# Patient Record
Sex: Male | Born: 1990
Health system: Southern US, Community
[De-identification: ages and names within clinical notes are randomized; demographics above are authoritative.]

## PROBLEM LIST (undated history)

## (undated) DIAGNOSIS — I214 Non-ST elevation (NSTEMI) myocardial infarction: Secondary | ICD-10-CM

## (undated) DIAGNOSIS — T7840XA Allergy, unspecified, initial encounter: Secondary | ICD-10-CM

## (undated) HISTORY — DX: Allergy, unspecified, initial encounter: T78.40XA

## (undated) HISTORY — PX: HIP ARTHROSCOPY: SHX668

## (undated) HISTORY — DX: Non-ST elevation (NSTEMI) myocardial infarction: I21.4

---

## 2014-04-10 HISTORY — PX: HIP ARTHROSCOPY: SHX668

## 2019-08-28 DIAGNOSIS — M545 Low back pain: Secondary | ICD-10-CM | POA: Diagnosis not present

## 2019-08-28 DIAGNOSIS — M791 Myalgia, unspecified site: Secondary | ICD-10-CM | POA: Diagnosis not present

## 2019-08-28 DIAGNOSIS — M9902 Segmental and somatic dysfunction of thoracic region: Secondary | ICD-10-CM | POA: Diagnosis not present

## 2019-08-28 DIAGNOSIS — M62830 Muscle spasm of back: Secondary | ICD-10-CM | POA: Diagnosis not present

## 2019-09-01 DIAGNOSIS — M791 Myalgia, unspecified site: Secondary | ICD-10-CM | POA: Diagnosis not present

## 2019-09-01 DIAGNOSIS — M9902 Segmental and somatic dysfunction of thoracic region: Secondary | ICD-10-CM | POA: Diagnosis not present

## 2019-09-01 DIAGNOSIS — M62830 Muscle spasm of back: Secondary | ICD-10-CM | POA: Diagnosis not present

## 2019-09-01 DIAGNOSIS — M545 Low back pain: Secondary | ICD-10-CM | POA: Diagnosis not present

## 2019-09-10 DIAGNOSIS — S86812A Strain of other muscle(s) and tendon(s) at lower leg level, left leg, initial encounter: Secondary | ICD-10-CM | POA: Diagnosis not present

## 2019-09-11 DIAGNOSIS — H5213 Myopia, bilateral: Secondary | ICD-10-CM | POA: Diagnosis not present

## 2019-09-11 DIAGNOSIS — H52223 Regular astigmatism, bilateral: Secondary | ICD-10-CM | POA: Diagnosis not present

## 2019-09-17 DIAGNOSIS — S86812A Strain of other muscle(s) and tendon(s) at lower leg level, left leg, initial encounter: Secondary | ICD-10-CM | POA: Diagnosis not present

## 2019-09-24 DIAGNOSIS — M545 Low back pain: Secondary | ICD-10-CM | POA: Diagnosis not present

## 2019-10-01 DIAGNOSIS — S86812A Strain of other muscle(s) and tendon(s) at lower leg level, left leg, initial encounter: Secondary | ICD-10-CM | POA: Diagnosis not present

## 2019-10-01 DIAGNOSIS — M545 Low back pain: Secondary | ICD-10-CM | POA: Diagnosis not present

## 2019-12-03 DIAGNOSIS — Z20822 Contact with and (suspected) exposure to covid-19: Secondary | ICD-10-CM | POA: Diagnosis not present

## 2020-01-01 DIAGNOSIS — Z23 Encounter for immunization: Secondary | ICD-10-CM | POA: Diagnosis not present

## 2020-03-08 DIAGNOSIS — Z23 Encounter for immunization: Secondary | ICD-10-CM | POA: Diagnosis not present

## 2020-04-06 DIAGNOSIS — Z20822 Contact with and (suspected) exposure to covid-19: Secondary | ICD-10-CM | POA: Diagnosis not present

## 2020-11-30 ENCOUNTER — Ambulatory Visit
Admission: EM | Admit: 2020-11-30 | Discharge: 2020-11-30 | Disposition: A | Discharge status: Other Acute Inpatient Hospital | Payer: BC Managed Care – PPO

## 2020-11-30 ENCOUNTER — Ambulatory Visit: Payer: Self-pay | Admitting: Medical

## 2020-11-30 ENCOUNTER — Ambulatory Visit (INDEPENDENT_AMBULATORY_CARE_PROVIDER_SITE_OTHER): Payer: BC Managed Care – PPO

## 2020-11-30 ENCOUNTER — Encounter: Payer: Self-pay | Admitting: Medical

## 2020-11-30 ENCOUNTER — Other Ambulatory Visit: Payer: Self-pay

## 2020-11-30 VITALS — BP 108/76 | HR 80 | Temp 98.6°F | Resp 18 | Wt 203.4 lb

## 2020-11-30 DIAGNOSIS — I319 Disease of pericardium, unspecified: Secondary | ICD-10-CM | POA: Diagnosis not present

## 2020-11-30 DIAGNOSIS — R9431 Abnormal electrocardiogram [ECG] [EKG]: Secondary | ICD-10-CM | POA: Diagnosis not present

## 2020-11-30 DIAGNOSIS — R079 Chest pain, unspecified: Secondary | ICD-10-CM | POA: Diagnosis not present

## 2020-11-30 DIAGNOSIS — R918 Other nonspecific abnormal finding of lung field: Secondary | ICD-10-CM | POA: Diagnosis not present

## 2020-11-30 DIAGNOSIS — R0781 Pleurodynia: Secondary | ICD-10-CM | POA: Diagnosis not present

## 2020-11-30 DIAGNOSIS — Z20822 Contact with and (suspected) exposure to covid-19: Secondary | ICD-10-CM | POA: Diagnosis not present

## 2020-11-30 DIAGNOSIS — I499 Cardiac arrhythmia, unspecified: Secondary | ICD-10-CM | POA: Diagnosis not present

## 2020-11-30 DIAGNOSIS — J90 Pleural effusion, not elsewhere classified: Secondary | ICD-10-CM | POA: Diagnosis not present

## 2020-11-30 DIAGNOSIS — M79622 Pain in left upper arm: Secondary | ICD-10-CM

## 2020-11-30 DIAGNOSIS — R0602 Shortness of breath: Secondary | ICD-10-CM | POA: Diagnosis not present

## 2020-11-30 DIAGNOSIS — R071 Chest pain on breathing: Secondary | ICD-10-CM

## 2020-11-30 DIAGNOSIS — J189 Pneumonia, unspecified organism: Secondary | ICD-10-CM | POA: Diagnosis not present

## 2020-11-30 DIAGNOSIS — M79602 Pain in left arm: Secondary | ICD-10-CM | POA: Diagnosis not present

## 2020-11-30 DIAGNOSIS — I300 Acute nonspecific idiopathic pericarditis: Secondary | ICD-10-CM | POA: Diagnosis not present

## 2020-11-30 DIAGNOSIS — I214 Non-ST elevation (NSTEMI) myocardial infarction: Secondary | ICD-10-CM | POA: Diagnosis not present

## 2020-11-30 DIAGNOSIS — R778 Other specified abnormalities of plasma proteins: Secondary | ICD-10-CM | POA: Diagnosis not present

## 2020-11-30 DIAGNOSIS — M549 Dorsalgia, unspecified: Secondary | ICD-10-CM

## 2020-11-30 DIAGNOSIS — R0789 Other chest pain: Secondary | ICD-10-CM | POA: Diagnosis not present

## 2020-11-30 NOTE — ED Triage Notes (Signed)
Pt c/o left-sided chest wall pain since Sunday night. Pt states he has pain with inhalation/exhalation and movements, also when laying on his back. Pt also reports pain to his left shoulder and arm that seems to come and go. Pt states the pain seems to improve during the day when he is more active and returns once he is at rest. Pt denies any known injuries or heavy lifting. Pt denies n/v or other symptoms.

## 2020-11-30 NOTE — Patient Instructions (Signed)
Nonspecific Chest Pain Chest pain can be caused by many different conditions. Some causes of chest pain can be life-threatening. These will require treatment right away. Serious causes of chest pain include: Heart attack. A tear in the body's main blood vessel. Redness and swelling (inflammation) around your heart. Blood clot in your lungs. Other causes of chest pain may not be so serious. These include: Heartburn. Anxiety or stress.  Damage to bones or muscles in your chest. Lung infections. Chest pain can feel like: Pain or discomfort in your chest. Crushing, pressure, aching, or squeezing pain. Burning or tingling. Dull or sharp pain that is worse when you move, cough, or take a deep breath. Pain or discomfort that is also felt in your back, neck, jaw, shoulder, or arm, or pain that spreads to any of these areas. It is hard to know whether your pain is caused by something that is serious or something that is not so serious. So it is important to see your doctor rightaway if you have chest pain. Follow these instructions at home: Medicines Take over-the-counter and prescription medicines only as told by your doctor. If you were prescribed an antibiotic medicine, take it as told by your doctor. Do not stop taking the antibiotic even if you start to feel better. Lifestyle  Rest as told by your doctor. Do not use any products that contain nicotine or tobacco, such as cigarettes, e-cigarettes, and chewing tobacco. If you need help quitting, ask your doctor. Do not drink alcohol. Make lifestyle changes as told by your doctor. These may include: Getting regular exercise. Ask your doctor what activities are safe for you. Eating a heart-healthy diet. A diet and nutrition specialist (dietitian) can help you to learn healthy eating options. Staying at a healthy weight. Treating diabetes or high blood pressure, if needed. Lowering your stress. Activities such as yoga and relaxation techniques  can help.  General instructions Pay attention to any changes in your symptoms. Tell your doctor about them or any new symptoms. Avoid any activities that cause chest pain. Keep all follow-up visits as told by your doctor. This is important. You may need more testing if your chest pain does not go away. Contact a doctor if: Your chest pain does not go away. You feel depressed. You have a fever. Get help right away if: Your chest pain is worse. You have a cough that gets worse, or you cough up blood. You have very bad (severe) pain in your belly (abdomen). You pass out (faint). You have either of these for no clear reason: Sudden chest discomfort. Sudden discomfort in your arms, back, neck, or jaw. You have shortness of breath at any time. You suddenly start to sweat, or your skin gets clammy. You feel sick to your stomach (nauseous). You throw up (vomit). You suddenly feel lightheaded or dizzy. You feel very weak or tired. Your heart starts to beat fast, or it feels like it is skipping beats. These symptoms may be an emergency. Do not wait to see if the symptoms will go away. Get medical help right away. Call your local emergency services (911 in the [place] Do not drive yourself to the hospital. Summary Chest pain can be caused by many different conditions. The cause may be serious and need treatment right away. If you have chest pain, see your doctor right away. Follow your doctor's instructions for taking medicines and making lifestyle changes. Keep all follow-up visits as told by your doctor. This includes visits for any   further testing if your chest pain does not go away. Be sure to know the signs that show that your condition has become worse. Get help right away if you have these symptoms. This information is not intended to replace advice given to you by your health care provider. Make sure you discuss any questions you have with your healthcare provider. Document Revised:  09/27/2017 Document Reviewed: 09/27/2017 Elsevier Patient Education  2022 Elsevier Inc.  

## 2020-11-30 NOTE — Progress Notes (Signed)
Subjective:    Patient ID: Cory Hamilton, male    DOB: 06/29/1990, 30 y.o.   MRN: [id number]  HPI 30 yo male in non acute distress presents today with chest and back pain worse at night while laying flat. started Sunday nigh whern laying down to go to bed, thought it was indigestion  (he drank a beer before bed).  Felt better today however hurts with breathing in and with laying down even without breathing in.Feels like the pain felt better laying on the left side. Shoulder and upper arm left side hurt  woke up yesterday with pain and worsen at  8pm. With shoulder and back pain increased with laying down shoulder and . Some shortness of breath and feels he cannot take a deep breath.  Denies pain in jaw. Took Ibuprofen 600 mg  yesterday 8:30 pm did seem to help with the pain. And today at  7am. No prior history of pain like this. Has no Anxiety history.  No history of Covid-19 took an at home test last night and it was negative. Vaccinated Pfizer and one booster.  Poor sleep since Sunday due to his pain when lying flat,  he has a an 70 week old boy and he has a 31 yo too.  Better last ngiht but took  Unisom  1/2 tablet at bedtime. 10pm.  No histroy of Marfans syndrome Parients are healthy except father just go diagnosis of diabetes this year. No cardiac history in family Grandfater died of a stroke but he does not know much more then that.   Blood pressure 108/76, pulse 80, temperature 98.6 F (37 C), temperature source Oral, resp. rate 18, weight 203 lb 6.4 oz (92.3 kg), SpO2 96 %.   No Known Allergies  Review of Systems  Constitutional:  Positive for fatigue. Negative for chills and fever (has felt hot at night).  HENT:  Negative for congestion, ear pain, sinus pressure, sinus pain, sneezing, sore throat and tinnitus.   Eyes:  Negative for visual disturbance.  Respiratory:  Positive for shortness of breath. Negative for cough.   Cardiovascular:  Positive for chest pain.   Gastrointestinal:  Negative for abdominal pain, blood in stool, constipation and diarrhea.  Genitourinary:  Negative for difficulty urinating.  Musculoskeletal:  Positive for back pain.  Allergic/Immunologic: Positive for environmental allergies. Negative for food allergies and immunocompromised state.  Neurological:  Negative for dizziness, syncope, light-headedness and headaches.  Hematological:  Negative for adenopathy.  Psychiatric/Behavioral:  Positive for sleep disturbance (due to pain and haviang an  28 week old at home and significant pain while laying down.). Negative for confusion and decreased concentration. The patient is not nervous/anxious.      6'2" 203.bs  Pneumonia at age 54 yo/ Took toddler to the pool on Sunday about  noon, no pain then.  Non smoker. Objective:   Physical Exam Vitals and nursing note reviewed.  Constitutional:      Appearance: Normal appearance. He is normal weight.  HENT:     Head: Normocephalic and atraumatic.     Right Ear: Tympanic membrane, ear canal and external ear normal.     Left Ear: Tympanic membrane, ear canal and external ear normal.     Mouth/Throat:     Mouth: Mucous membranes are moist.     Pharynx: Oropharynx is clear.  Eyes:     Extraocular Movements: Extraocular movements intact.     Conjunctiva/sclera: Conjunctivae normal.     Pupils: Pupils are equal, round, and  reactive to light.  Cardiovascular:     Rate and Rhythm: Normal rate and regular rhythm.     Heart sounds: Normal heart sounds.  Pulmonary:     Effort: Pulmonary effort is normal.     Breath sounds: Rhonchi present. Wheezes: left side. Musculoskeletal:        General: Normal range of motion.     Cervical back: Normal range of motion and neck supple.  Skin:    General: Skin is warm and dry.     Capillary Refill: Capillary refill takes less than 2 seconds.  Neurological:     General: No focal deficit present.     Mental Status: He is alert and oriented to  person, place, and time.  Psychiatric:        Mood and Affect: Mood normal.        Behavior: Behavior normal.        Thought Content: Thought content normal.   He does not appear in any distress, no SOB, no grimacing with pain.  Rumbling sound on the left middle back     Assessment & Plan:  Chest and back pain. Shortness of breath. Increased pain while lying flat, improved with lying on left side. I recommend he go to an urgent care for further evaluation like a  CXR, labs, EKG. He currently does not have [employer] so  I am unable to order labs or tests. He lives over by [facility] and will stop by after work. Patient verbalizes understanding and has no questions at discharge. Given discharge paperwork and my card to call if he has any concerns.

## 2020-11-30 NOTE — Discharge Instructions (Addendum)
I do not have a good reason for you to have a fluid collection in the pleural space of your lung on the left.  I believe this is what is causing her chest pain but you need blood work and advanced imaging of your chest to determine the source.  As we discussed, I feel you would be best served to be evaluated in the emergency department.  As you have elected, please go to [facility] for further evaluation.

## 2020-11-30 NOTE — ED Provider Notes (Signed)
[facility]    CSN: [id number] Arrival date & time: 11/30/20  1646      History   Chief Complaint Chief Complaint  Patient presents with   Chest Pain    ribs, left    HPI Cory Hamilton is a 30 y.o. male.   HPI  30 year old male here for evaluation of chest pain.  Patient reports that 2 nights ago he went to lay down for bed and he noticed that he was having pain on his left side of his chest.  He states that he has pain with deep breathing and it hurts when he lays on his back, his chest, on his left side.  He also reports that the pain seems to get little better when he lays on his left side.  This is associated with some shortness of breath because he states he cannot take a deep breath.  The pain is worse at night and better throughout the day.  He denies cough, nausea, sweating, heavy lifting, recent travel.  He is not a smoker and he is not had any injury that he is aware of.  Patient does report that the pain feels better when he leans forward and he has found himself doing that multiple times throughout the day.  He is currently rating his pain as a 7/10.  The pain is constant in nature and he describes it as an ache in the shoulder but a stabbing pain in the chest.  History reviewed. No pertinent past medical history.  There are no problems to display for this patient.   Past Surgical History:  Procedure Laterality Date   HIP ARTHROSCOPY N/A    2016       Home Medications    Prior to Admission medications   Medication Sig Start Date End Date Taking? Authorizing Provider  fexofenadine (ALLEGRA) 180 MG tablet Take 180 mg by mouth daily.   Yes [provider]  ibuprofen (ADVIL) 600 MG tablet Take 600 mg by mouth every 6 (six) hours as needed.    [provider]    Family History Family History  Problem Relation Age of Onset   Cancer Maternal Grandmother    Stroke Maternal Grandfather     Social History Social History    Tobacco Use   Smoking status: Never   Smokeless tobacco: Never  Substance Use Topics   Alcohol use: Yes    Alcohol/week: 9.0 standard drinks    Types: 9 Standard drinks or equivalent per week   Drug use: Never     Allergies   Patient has no known allergies.   Review of Systems Review of Systems  Constitutional:  Negative for activity change, appetite change, diaphoresis and fever.  Respiratory:  Positive for shortness of breath. Negative for cough and wheezing.   Cardiovascular:  Positive for chest pain.  Gastrointestinal:  Negative for nausea.  Musculoskeletal:  Negative for myalgias.  Hematological: Negative.   Psychiatric/Behavioral: Negative.      Physical Exam Triage Vital Signs ED Triage Vitals  Enc Vitals Group     BP 11/30/20 1703 130/86     Pulse Rate 11/30/20 1703 68     Resp 11/30/20 1703 18     Temp 11/30/20 1703 99.2 F (37.3 C)     Temp Source 11/30/20 1703 Oral     SpO2 11/30/20 1703 100 %     Weight 11/30/20 1701 203 lb (92.1 kg)     Height 11/30/20 1701 6\' 2"  (1.88  m)     Head Circumference --      Peak Flow --      Pain Score 11/30/20 1701 7     Pain Loc --      Pain Edu? --      Excl. in GC? --    No data found.  Updated Vital Signs BP 130/86 (BP Location: Left Arm)   Pulse 68   Temp 99.2 F (37.3 C) (Oral)   Resp 18   Ht 6\' 2"  (1.88 m)   Wt 203 lb (92.1 kg)   SpO2 100%   BMI 26.06 kg/m   Visual Acuity Right Eye Distance:   Left Eye Distance:   Bilateral Distance:    Right Eye Near:   Left Eye Near:    Bilateral Near:     Physical Exam Vitals and nursing note reviewed.  Constitutional:      General: He is not in acute distress.    Appearance: Normal appearance. He is well-developed and normal weight. He is not ill-appearing.  HENT:     Head: Normocephalic and atraumatic.  Neck:     Vascular: No carotid bruit.  Cardiovascular:     Rate and Rhythm: Normal rate and regular rhythm.     Pulses: Normal pulses.     Heart  sounds: Normal heart sounds. No murmur heard.   No friction rub. No gallop.  Pulmonary:     Effort: Pulmonary effort is normal.     Breath sounds: Normal breath sounds. No wheezing, rhonchi or rales.  Chest:     Chest wall: No tenderness.  Musculoskeletal:     Cervical back: Normal range of motion and neck supple.  Skin:    General: Skin is warm and dry.     Capillary Refill: Capillary refill takes less than 2 seconds.  Neurological:     General: No focal deficit present.     Mental Status: He is alert and oriented to person, place, and time.  Psychiatric:        Mood and Affect: Mood normal.        Behavior: Behavior normal.        Thought Content: Thought content normal.        Judgment: Judgment normal.     UC Treatments / Results  Labs (all labs ordered are listed, but only abnormal results are displayed) Labs Reviewed - No data to display  EKG   Radiology DG Chest 2 View  Result Date: 11/30/2020 CLINICAL DATA:  Left-sided chest wall pain for 2 days, pain with respiration EXAM: CHEST - 2 VIEW COMPARISON:  None. FINDINGS: Frontal and lateral views of the chest demonstrate an unremarkable cardiac silhouette. No acute airspace disease or pneumothorax. Trace left pleural effusion blunting the left lateral costophrenic angle. No acute bony abnormalities. IMPRESSION: 1. Trace left pleural effusion. 2. No acute airspace disease.  The Electronically Signed   By: 12/02/2020 M.D.   On: 11/30/2020 17:42    Procedures Procedures (including critical care time)  Medications Ordered in UC Medications - No data to display  Initial Impression / Assessment and Plan / UC Course  I have reviewed the triage vital signs and the nursing notes.  Pertinent labs & imaging results that were available during my care of the patient were reviewed by me and considered in my medical decision making (see chart for details).  Patient is a very pleasant, nontoxic-appearing 30 year old male here  for evaluation of left-sided chest pain that has been  present for the last 2 days it is not associated with cough, nausea, sweating, injury, heavy lifting, travel, or neck pain.  Patient reports that he first noticed the pain when he went to lay down to go to sleep and he does endorse that the pain is worse at night and then improves throughout the day when he is up moving.  The pain is worse if he lays on his back, left arm, or chest.  He states that laying on his left arm does improve the pain some.  He also endorses that if he leans forward the pain improves.  He denies any palpitations, or syncope.  Patient's physical exam reveals a benign cardiopulmonary exam with S1-S2 heart sounds and are free of murmur, rub, or gallop.  Lung sounds are clear auscultation all fields.  The chest pain is not reproducible with palpation of the chest wall either anterior or posterior.  No carotid bruits appreciated on exam.  Patient's peripheral pulses are 2+ globally.  Etiology of patient's chest pain is unclear.  The pain does worsen with movement or deep breathing.  Will obtain chest x-ray and EKG.  Radiology interpretation of chest x-ray is there is a trace left pleural effusion with no additional acute airspace disease.  There is a blunting of the left costophrenic angle.  EKG shows sinus bradycardia with a ventricular rate of 59 bpm, PR interval of 154 ms, QRS duration 96 ms, QT 406 ms, QTC 401 ms.  There is no ST elevation or depression noted on tracing.  I advised the patient that I do not have any good reason for him to have a pleural effusion as he has no history of cancer, lung injury, or infection.  He needs additional blood work, which we are unable to do at this time at the urgent care, and may be more advanced imaging of his chest.  Advised patient that I would recommend he go to the emergency department for evaluation and he has agreed.  He is elected to go to [facility] in stable condition via  privately owned vehicle.  Final Clinical Impressions(s) / UC Diagnoses   Final diagnoses:  Chest pain, unspecified type     Discharge Instructions      I do not have a good reason for you to have a fluid collection in the pleural space of your lung on the left.  I believe this is what is causing her chest pain but you need blood work and advanced imaging of your chest to determine the source.  As we discussed, I feel you would be best served to be evaluated in the emergency department.  As you have elected, please go to [facility] for further evaluation.     ED Prescriptions   None    PDMP not reviewed this encounter.   Becky Augusta, NP 11/30/20 [phone]

## 2020-11-30 NOTE — ED Notes (Addendum)
Patient is being discharged from the Urgent Care and sent to the Emergency Department via POV . Per Becky Augusta, NP, patient is in need of higher level of care due to cxr results showing pleural effusion . Patient is aware and verbalizes understanding of plan of care.  Vitals:   11/30/20 1703  BP: 130/86  Pulse: 68  Resp: 18  Temp: 99.2 F (37.3 C)  SpO2: 100%

## 2020-12-01 DIAGNOSIS — I319 Disease of pericardium, unspecified: Secondary | ICD-10-CM

## 2020-12-01 DIAGNOSIS — J189 Pneumonia, unspecified organism: Secondary | ICD-10-CM | POA: Insufficient documentation

## 2020-12-01 DIAGNOSIS — R7989 Other specified abnormal findings of blood chemistry: Secondary | ICD-10-CM | POA: Insufficient documentation

## 2020-12-01 DIAGNOSIS — R778 Other specified abnormalities of plasma proteins: Secondary | ICD-10-CM | POA: Insufficient documentation

## 2020-12-01 DIAGNOSIS — R0781 Pleurodynia: Secondary | ICD-10-CM | POA: Insufficient documentation

## 2020-12-01 HISTORY — DX: Pneumonia, unspecified organism: J18.9

## 2020-12-01 HISTORY — DX: Other specified abnormal findings of blood chemistry: R79.89

## 2020-12-01 HISTORY — DX: Disease of pericardium, unspecified: I31.9

## 2020-12-02 ENCOUNTER — Telehealth: Payer: Self-pay | Admitting: Nurse Practitioner

## 2020-12-02 ENCOUNTER — Other Ambulatory Visit: Payer: Self-pay

## 2020-12-02 DIAGNOSIS — J90 Pleural effusion, not elsewhere classified: Secondary | ICD-10-CM

## 2020-12-02 NOTE — Progress Notes (Signed)
Patient calling into [facility] for follow up today after visit earlier this week. Patient was directed to Emergency Room for acute cardio/respiratory symptoms.   Shortness of breath had acute onset without any URI symptoms prior. Respiratory panel in ED was negative for RSV/FLU/COVID.   CT of chest and Xray performed to IMPRESSION: No large pulmonary embolism.  Consolidative and groundglass opacities in the left lower lobe concerning for infection or aspiration. Recommend follow-up imaging in 4-6 weeks to document resolution. This results in vasoconstriction and along with the cardiac pulsation artifact limits assessment of the small subsegmental vessels of the left lower lobe.  Small left pleural effusion.  Patient had acute onset of chest pain with SOB. Had elevated troponin at time of admission  EKG: NORMAL SINUS RHYTHM  POSSIBLE LEFT ATRIAL ENLARGEMENT  NONSPECIFIC T WAVE ABNORMALITY  ABNORMAL ECG  WHEN COMPARED WITH ECG OF 30-Nov-2020 18:49,  NONSPECIFIC T WAVE ABNORMALITY NOW EVIDENT IN ANTERIOR LEADS  QT HAS LENGTHENED    Patient currently has no PCP, was advised to have follow up in 2 weeks, repeat imaging in 6-8 weeks to assure resolution.   Is currently on antibiotics Amoxicillin and Doxycycline   Current Outpatient Medications  Medication Instructions   amoxicillin (AMOXIL) 500 MG capsule Oral   colchicine 0.6 mg, Oral, 2 times daily   doxycycline (VIBRAMYCIN) 100 MG capsule Oral   fexofenadine (ALLEGRA) 180 MG tablet Oral   fexofenadine (ALLEGRA) 180 mg, Oral, Daily   ibuprofen (ADVIL) 600 mg, Oral, Every 6 hours PRN   lidocaine (LIDODERM) 5 % Transdermal     After review of chart without pertinent history advised referral to pulmonology for evaluation of images and second opinion. Will initiate referral since patient does not currently have PCP  If unable to be seen in the next two weeks will have patient follow up with [facility] at [facility]  to discuss ordering follow up imaging as advised

## 2020-12-17 ENCOUNTER — Ambulatory Visit
Admission: RE | Admit: 2020-12-17 | Discharge: 2020-12-17 | Disposition: A | Discharge status: Home / Self Care | Payer: BC Managed Care – PPO | Source: Ambulatory Visit | Attending: Family Medicine | Admitting: Family Medicine

## 2020-12-17 ENCOUNTER — Ambulatory Visit: Payer: BC Managed Care – PPO | Admitting: Family Medicine

## 2020-12-17 ENCOUNTER — Other Ambulatory Visit: Payer: Self-pay

## 2020-12-17 ENCOUNTER — Ambulatory Visit
Admission: RE | Admit: 2020-12-17 | Discharge: 2020-12-17 | Disposition: A | Discharge status: Home / Self Care | Payer: BC Managed Care – PPO | Attending: Family Medicine | Admitting: Family Medicine

## 2020-12-17 ENCOUNTER — Encounter: Payer: Self-pay | Admitting: Family Medicine

## 2020-12-17 VITALS — BP 128/80 | HR 66 | Temp 98.2°F | Ht 74.0 in | Wt 203.0 lb

## 2020-12-17 DIAGNOSIS — J189 Pneumonia, unspecified organism: Secondary | ICD-10-CM

## 2020-12-17 DIAGNOSIS — I319 Disease of pericardium, unspecified: Secondary | ICD-10-CM

## 2020-12-17 DIAGNOSIS — S8991XA Unspecified injury of right lower leg, initial encounter: Secondary | ICD-10-CM | POA: Diagnosis not present

## 2020-12-17 DIAGNOSIS — S8990XA Unspecified injury of unspecified lower leg, initial encounter: Secondary | ICD-10-CM | POA: Diagnosis not present

## 2020-12-17 MED ORDER — COLCHICINE 0.6 MG PO TABS: 0.6000 mg | ORAL_TABLET | Freq: Two times a day (BID) | ORAL | 1 refills | Status: DC

## 2020-12-17 NOTE — Patient Instructions (Addendum)
-   Obtain blood work with order provided - Obtain knee x-ray with order provided - Referral coordinator will contact regards to cardiology follow-up - Continue colchicine 0.6 mg twice daily - Increase activity in a stepwise manner as discussed using chest and knee symptoms as a guide - Return for follow-up for right knee - We will coordinate a future follow-up after you have seen cardiology - Contact our office for any questions over the interim

## 2020-12-17 NOTE — Progress Notes (Signed)
Primary Care / Sports Medicine Office Visit  Patient Information:  Patient ID: Cory Hamilton, male DOB: 07-16-1990 Age: 30 y.o. MRN: [id number]   Cory Hamilton is a pleasant 30 y.o. male presenting with the following:  Chief Complaint  Patient presents with   New Patient (Initial Visit)   Establish Care    [facility] follow-up, admitted 11/30/20 for chest pain, pneumonia, NSTEMI; no longer symptomatic per patient    Review of Systems pertinent details above   Patient Active Problem List   Diagnosis Date Noted   Community acquired pneumonia of left lower lobe of lung 12/01/2020   Elevated troponin 12/01/2020   Myopericarditis 12/01/2020   Pleuritic chest pain 12/01/2020   Past Medical History:  Diagnosis Date   NSTEMI (non-ST elevated myocardial infarction) [facility])    Outpatient Encounter Medications as of 12/17/2020  Medication Sig   fexofenadine (ALLEGRA) 180 MG tablet Take 180 mg by mouth daily.   lidocaine (LIDODERM) 5 % Place 1 patch onto the skin daily.   [DISCONTINUED] colchicine 0.6 MG tablet Take 0.6 mg by mouth 2 (two) times daily.   colchicine 0.6 MG tablet Take 1 tablet (0.6 mg total) by mouth 2 (two) times daily.   ibuprofen (ADVIL) 600 MG tablet Take 600 mg by mouth every 6 (six) hours as needed. (Patient not taking: Reported on 12/17/2020)   [DISCONTINUED] doxycycline (VIBRAMYCIN) 100 MG capsule Take by mouth.   [DISCONTINUED] fexofenadine (ALLEGRA) 180 MG tablet Take by mouth.   No facility-administered encounter medications on file as of 12/17/2020.   Past Surgical History:  Procedure Laterality Date   HIP ARTHROSCOPY  2016    Vitals:   12/17/20 1438  BP: 128/80  Pulse: 66  Temp: 98.2 F (36.8 C)  SpO2: 97%   Vitals:   12/17/20 1438  Weight: 203 lb (92.1 kg)  Height: 6\' 2"  (1.88 m)   Body mass index is 26.06 kg/m.  DG Chest 2 View  Result Date: 11/30/2020 CLINICAL DATA:  Left-sided chest wall pain for 2 days, pain with  respiration EXAM: CHEST - 2 VIEW COMPARISON:  None. FINDINGS: Frontal and lateral views of the chest demonstrate an unremarkable cardiac silhouette. No acute airspace disease or pneumothorax. Trace left pleural effusion blunting the left lateral costophrenic angle. No acute bony abnormalities. IMPRESSION: 1. Trace left pleural effusion. 2. No acute airspace disease.  The Electronically Signed   By: 12/02/2020 M.D.   On: 11/30/2020 17:42     Independent interpretation of notes and tests performed by another provider:   None  Procedures performed:   None  Pertinent History, Exam, Impression, and Recommendations:   Community acquired pneumonia of left lower lobe of lung Noted on work-up for chest pain evaluation, was admitted to [facility] on 11/30/2020, was noted to have elevated troponin which trended down during admission, evidence of myopericarditis with elevated inflammatory markers.  Patient was given antibiotics, started on colchicine 0.6 mg twice daily, and was discharged on 12/01/2020.  He did complete 5 days of antibiotics after discharge from hospital, denies any current respiratory symptoms, noted these to resolve shortly after discharge, has been compliant with medication activity guidelines.  Pertinent physical exam findings today reveal oxygen saturation of 97%, patient is resting comfortably in no acute distress, clear lung fields throughout without wheeze, rales, rhonchi.  CT scan obtained while admitted did show left lower lung field consolidation, per radiologist this was recommended to be followed up in 4-6 weeks, chest x-ray  future order was placed and the patient was advised to proceed with this appropriately in 4 weeks.  Myopericarditis See additional assessment(s) for plan details.  Noted to have evidence of myopericarditis while admitted, positional chest pain noted, this is since resolved since discharge from hospital.  He has been compliant with twice daily colchicine  0.6 mg dosing.  He had previously been noting chest pain with positional change and deep inspiration, this has also resolved.  Physical examination today significant for prominent S2, otherwise regular rate and rhythm, while leaning forward a questionable friction rub appreciated.  No JVD noted.  I reviewed patient's current symptomatology and physical examination findings, clinically he is doing well with scheduled colchicine.  After a discussion on next steps, shared decision making pursued and he is amenable to follow-up with cardiology for additional evaluation and management options inclusive of duration for colchicine dosing among evaluation of risk factors.  I have ordered autoimmune serum work-up which we will follow once resulted.   Orders & Medications Meds ordered this encounter  Medications   colchicine 0.6 MG tablet    Sig: Take 1 tablet (0.6 mg total) by mouth 2 (two) times daily.    Dispense:  60 tablet    Refill:  1   Orders Placed This Encounter  Procedures   DG Chest 2 View   DG Knee Complete 4 Views Right   ANA 12 Plus Profile (RDL)   Ambulatory referral to Cardiology     Return for Right knee eval.     Jerrol Banana, MD   Primary Care Sports Medicine [facility]

## 2020-12-17 NOTE — Assessment & Plan Note (Signed)
Noted on work-up for chest pain evaluation, was admitted to [facility] on 11/30/2020, was noted to have elevated troponin which trended down during admission, evidence of myopericarditis with elevated inflammatory markers.  Patient was given antibiotics, started on colchicine 0.6 mg twice daily, and was discharged on 12/01/2020.  He did complete 5 days of antibiotics after discharge from hospital, denies any current respiratory symptoms, noted these to resolve shortly after discharge, has been compliant with medication activity guidelines.  Pertinent physical exam findings today reveal oxygen saturation of 97%, patient is resting comfortably in no acute distress, clear lung fields throughout without wheeze, rales, rhonchi.  CT scan obtained while admitted did show left lower lung field consolidation, per radiologist this was recommended to be followed up in 4-6 weeks, chest x-ray future order was placed and the patient was advised to proceed with this appropriately in 4 weeks.

## 2020-12-17 NOTE — Assessment & Plan Note (Addendum)
See additional assessment(s) for plan details.  Noted to have evidence of myopericarditis while admitted, positional chest pain noted, this is since resolved since discharge from hospital.  He has been compliant with twice daily colchicine 0.6 mg dosing.  He had previously been noting chest pain with positional change and deep inspiration, this has also resolved.  Physical examination today significant for prominent S2, otherwise regular rate and rhythm, while leaning forward a questionable friction rub appreciated.  No JVD noted.  I reviewed patient's current symptomatology and physical examination findings, clinically he is doing well with scheduled colchicine.  After a discussion on next steps, shared decision making pursued and he is amenable to follow-up with cardiology for additional evaluation and management options inclusive of duration for colchicine dosing among evaluation of risk factors.  I have ordered autoimmune serum work-up which we will follow once resulted.

## 2020-12-23 NOTE — Progress Notes (Signed)
[name], there are minor findings at the patellofemoral compartment of the knee which we can review alongside your symptoms at your visit on 12/24/20

## 2020-12-24 ENCOUNTER — Ambulatory Visit (INDEPENDENT_AMBULATORY_CARE_PROVIDER_SITE_OTHER): Payer: BC Managed Care – PPO | Admitting: Family Medicine

## 2020-12-24 ENCOUNTER — Encounter: Payer: Self-pay | Admitting: Family Medicine

## 2020-12-24 ENCOUNTER — Other Ambulatory Visit: Payer: Self-pay

## 2020-12-24 VITALS — BP 112/72 | HR 66 | Temp 98.1°F | Ht 74.0 in | Wt 202.0 lb

## 2020-12-24 DIAGNOSIS — Z23 Encounter for immunization: Secondary | ICD-10-CM | POA: Insufficient documentation

## 2020-12-24 DIAGNOSIS — M2391 Unspecified internal derangement of right knee: Secondary | ICD-10-CM | POA: Diagnosis not present

## 2020-12-24 DIAGNOSIS — M222X1 Patellofemoral disorders, right knee: Secondary | ICD-10-CM | POA: Diagnosis not present

## 2020-12-24 NOTE — Assessment & Plan Note (Signed)
Annual influenza vaccine administered today. 

## 2020-12-24 NOTE — Patient Instructions (Signed)
-   Wear knee brace throughout your wakeful hours x2 weeks then reserve use for periods of athletic/strenuous activity - Start physical therapy with referral provided, they will instruct on home exercises to perform daily - Continue colchicine as it provides anti-inflammatory benefits - Return for follow-up in 6 weeks, contact our office for questions between now and then

## 2020-12-24 NOTE — Assessment & Plan Note (Addendum)
>>  ASSESSMENT AND PLAN FOR RIGHT PATELLOFEMORAL SYNDROME WRITTEN ON 12/24/2020 11:32 AM BY Starlena Beil J, MD  Patient with anteromedial knee pain since traumatic onset 10/2020 where he was kicked at the medial right knee while playing soccer game.  He was able to continue playing, did not seek medical attention as pain has been relatively minimal since that time.  He has noted intermittent sharp pain to the medial knee, additionally significant stiffness with motion after period of immobility.  He does note minor sensations of instability associated with these, however denies buckling, near buckling, mechanical catching.  Examination reveals full painless range of motion from 0-135 degrees, tenderness at the medial joint line, positive medial McMurray, equivocal valgus stressing without laxity though with pain, dynamic patellar maltracking noted; remainder of examination benign.  Given his stated symptomatology, clinical and radiographic findings, patient has patellofemoral maltracking and concern for medial meniscus pathology that is most likely secondary to his stated trauma.  That being said, his findings are overall reassuring and I have advised formal physical therapy, lateral J hinged stabilizing brace, and follow-up in 6 weeks for reevaluation.  He is currently on colchicine  which, given anti-inflammatory effects, he was advised to continue with medication management performed.  If suboptimal progress noted, anticipate advanced imaging or pharmacotherapy options.   >>ASSESSMENT AND PLAN FOR INTERNAL DERANGEMENT OF MULTIPLE SITES OF RIGHT KNEE WRITTEN ON 12/24/2020 11:33 AM BY Gita Dilger J, MD  See additional assessment(s) for plan details.

## 2020-12-24 NOTE — Assessment & Plan Note (Signed)
See additional assessment(s) for plan details. 

## 2020-12-24 NOTE — Progress Notes (Addendum)
Primary Care / Sports Medicine Office Visit  Patient Information:  Patient ID: Cory Hamilton, male DOB: 26-May-1990 Age: 30 y.o. MRN: [id number]   Cory Hamilton is a pleasant 30 y.o. male presenting with the following:  Chief Complaint  Patient presents with   Knee Pain    Right; X-Ray 12/17/20; kicked in the right knee while playing a soccer game 10/2020; stiffness and instability associated; took ibuprofen and used ice with some relief; 1/10 pain    Review of Systems pertinent details above   Patient Active Problem List   Diagnosis Date Noted   Right patellofemoral syndrome 12/24/2020   Internal derangement of multiple sites of right knee 12/24/2020   Need for immunization against influenza 12/24/2020   Community acquired pneumonia of left lower lobe of lung 12/01/2020   Elevated troponin 12/01/2020   Myopericarditis 12/01/2020   Pleuritic chest pain 12/01/2020   Past Medical History:  Diagnosis Date   NSTEMI (non-ST elevated myocardial infarction) [facility])    Outpatient Encounter Medications as of 12/24/2020  Medication Sig   colchicine 0.6 MG tablet Take 1 tablet (0.6 mg total) by mouth 2 (two) times daily.   fexofenadine (ALLEGRA) 180 MG tablet Take 180 mg by mouth daily. (Patient not taking: Reported on 12/24/2020)   ibuprofen (ADVIL) 600 MG tablet Take 600 mg by mouth every 6 (six) hours as needed. (Patient not taking: No sig reported)   [DISCONTINUED] lidocaine (LIDODERM) 5 % Place 1 patch onto the skin daily.   No facility-administered encounter medications on file as of 12/24/2020.   Past Surgical History:  Procedure Laterality Date   HIP ARTHROSCOPY Left 2016    Vitals:   12/24/20 0829  BP: 112/72  Pulse: 66  Temp: 98.1 F (36.7 C)  SpO2: 97%   Vitals:   12/24/20 0829  Weight: 202 lb (91.6 kg)  Height: 6\' 2"  (1.88 m)   Body mass index is 25.94 kg/m.  DG Chest 2 View  Result Date: 11/30/2020 CLINICAL DATA:  Left-sided chest wall pain  for 2 days, pain with respiration EXAM: CHEST - 2 VIEW COMPARISON:  None. FINDINGS: Frontal and lateral views of the chest demonstrate an unremarkable cardiac silhouette. No acute airspace disease or pneumothorax. Trace left pleural effusion blunting the left lateral costophrenic angle. No acute bony abnormalities. IMPRESSION: 1. Trace left pleural effusion. 2. No acute airspace disease.  The Electronically Signed   By: 12/02/2020 M.D.   On: 11/30/2020 17:42   DG Knee Complete 4 Views Right  Result Date: 12/21/2020 CLINICAL DATA:  Pain, hit in the knee playing soccer EXAM: RIGHT KNEE - COMPLETE 4+ VIEW COMPARISON:  None. FINDINGS: There is no acute fracture or dislocation. Knee alignment is normal. The joint spaces are preserved. There is no effusion. IMPRESSION: Normal knee radiographs. Electronically Signed   By: 12/23/2020 M.D.   On: 12/21/2020 09:12     Independent interpretation of notes and tests performed by another provider:   Independent interpretation of right knee x-ray dated 12/17/2020 reveals maintained articular space tricompartmentally, lateral deviation of the patella noted on AP view, this corrects on Desha view, minimal cortical irregularity at the medial femoral condyle on sunrise view  Procedures performed:   None  Pertinent History, Exam, Impression, and Recommendations:   Right patellofemoral syndrome Patient with anteromedial knee pain since traumatic onset 10/2020 where he was kicked at the medial right knee while playing soccer game.  He was able to continue playing, did not  seek medical attention as pain has been relatively minimal since that time.  He has noted intermittent sharp pain to the medial knee, additionally significant stiffness with motion after period of immobility.  He does note minor sensations of instability associated with these, however denies buckling, near buckling, mechanical catching.  Examination reveals full painless range of motion from  0-135 degrees, tenderness at the medial joint line, positive medial McMurray, equivocal valgus stressing without laxity though with pain, dynamic patellar maltracking noted; remainder of examination benign.  Given his stated symptomatology, clinical and radiographic findings, patient has patellofemoral maltracking and concern for medial meniscus pathology that is most likely secondary to his stated trauma.  That being said, his findings are overall reassuring and I have advised formal physical therapy, lateral J hinged stabilizing brace, and follow-up in 6 weeks for reevaluation.  He is currently on colchicine which, given anti-inflammatory effects, he was advised to continue with medication management performed.  If suboptimal progress noted, anticipate advanced imaging or pharmacotherapy options.  Internal derangement of multiple sites of right knee See additional assessment(s) for plan details.  Need for immunization against influenza Annual influenza vaccine administered today.    Orders & Medications No orders of the defined types were placed in this encounter.  Orders Placed This Encounter  Procedures   Flu Vaccine QUAD 46mo+IM (Fluarix, Fluzone & Alfiuria Quad PF)   Ambulatory referral to Physical Therapy     Return in about 6 weeks (around 02/04/2021).     Cory Banana, MD   Primary Care Sports Medicine [facility]

## 2020-12-27 ENCOUNTER — Other Ambulatory Visit: Payer: Self-pay | Admitting: Family Medicine

## 2020-12-27 DIAGNOSIS — R768 Other specified abnormal immunological findings in serum: Secondary | ICD-10-CM

## 2020-12-27 DIAGNOSIS — R946 Abnormal results of thyroid function studies: Secondary | ICD-10-CM

## 2020-12-27 LAB — ANA 12 PLUS PROFILE, POSITIVE
Anti-CCP Ab, IgG & IgA (RDL): <20 Units (ref <20)
Anti-Cardiolipin Ab, IgA (RDL): <12 APL U/mL (ref <12)
Anti-Cardiolipin Ab, IgG (RDL): <15 GPL U/mL (ref <15)
Anti-Cardiolipin Ab, IgM (RDL): 15 MPL U/mL (ref <13)
Anti-Centromere Ab (RDL): <1:40 {titer}
Anti-Chromatin Ab, IgG (RDL): <20 Units (ref <20)
Anti-La (SS-B) Ab (RDL): <20 Units (ref <20)
Anti-Ro (SS-A) Ab (RDL): <20 Units (ref <20)
Anti-Scl-70 Ab (RDL): <20 Units (ref <20)
Anti-Sm Ab (RDL): <20 Units (ref <20)
Anti-TPO Ab (RDL): 126.7 IU/mL — ABNORMAL HIGH (ref <9.0)
Anti-U1 RNP Ab (RDL): <20 Units (ref <20)
Anti-dsDNA Ab by Farr(RDL): <8 IU/mL (ref <8.0)
C3 Complement (RDL): 96 mg/dL (ref 82–167)
C4 Complement (RDL): 16 mg/dL (ref 14–44)
Rheumatoid Factor by Turb RDL: <14 IU/mL (ref <14)
Speckled Pattern: 1:160 {titer} — ABNORMAL HIGH

## 2020-12-27 LAB — ANA 12 PLUS PROFILE (RDL): Anti-Nuclear Ab by IFA (RDL): POSITIVE — AB

## 2020-12-27 NOTE — Progress Notes (Signed)
[name], the labs we ordered have resulted showing the possibility of lupus and an antibody that elevates in an autoimmune thyroid condition. These are screening tests and I have placed referrals to both rheumatology (autoimmune specialists) and endocrinology (thyroid, etc specialists) to weigh in and they can determine the significance of these results / need for additional tests. Please reach out for questions and we can touch base in person at your visit on 10/28.

## 2020-12-31 ENCOUNTER — Ambulatory Visit: Payer: BC Managed Care – PPO | Attending: Family Medicine

## 2020-12-31 ENCOUNTER — Other Ambulatory Visit: Payer: Self-pay

## 2020-12-31 DIAGNOSIS — M25561 Pain in right knee: Secondary | ICD-10-CM | POA: Diagnosis not present

## 2020-12-31 DIAGNOSIS — Z23 Encounter for immunization: Secondary | ICD-10-CM | POA: Diagnosis not present

## 2020-12-31 NOTE — Therapy (Signed)
[facility] [address] [place], [place], [address] Phone: [phone]   Fax:  [phone]  Physical Therapy Evaluation  Patient Details  Name: Cory Hamilton MRN: [id number] Date of Birth: November 21, 1990 Referring Provider (PT): Dr. Ashley Royalty   Encounter Date: 12/31/2020   PT End of Session - 01/02/21 1929     Visit Number 1    Number of Visits 17    Date for PT Re-Evaluation 02/25/21    Authorization Type eval: 12/31/20    PT Start Time 1100    PT Stop Time 1145    PT Time Calculation (min) 45 min    Activity Tolerance Patient tolerated treatment well;No increased pain    Behavior During Therapy WFL for tasks assessed/performed             Past Medical History:  Diagnosis Date   NSTEMI (non-ST elevated myocardial infarction) [facility])     Past Surgical History:  Procedure Laterality Date   HIP ARTHROSCOPY Left 2016    There were no vitals filed for this visit.    Subjective Assessment - 01/02/21 1913     Subjective Knee pain    Pertinent History Patient with anteromedial knee pain since traumatic onset 10/2020 where he was kicked in the medial right knee while playing a soccer game. He had immediate pain at the time of the injury. He played later that week and the pain wasn't debilitating but he knew he had injured his knee. He did not notice any bruising or swelling but does report some painless clicking in his knee. He has been able to continue jogging/running since the injury but notices pain when he tries to cut laterally. He saw Dr. Ashley Royalty and had plain film radiographs on 12/17/20 which based on independent interpretation by Dr. Ashley Royalty showed maintained articular space tricompartmentally, lateral deviation of the patella noted on AP view, this corrects on [place] view, minimal cortical irregularity at the medial femoral condyle on sunrise view. Dr. Ashley Royalty expressed some concern about possible medial meniscus  pathology as well as maltracking of his patella. He was advised to wear a lateral J hinged stabilizing brace which he has been wearing for the last week and has noticed some improvement in his knee stiffness after prolonged sitting. He was also advised to continue his colchicine for its anti-inflammatory effects. Pt with a remote history of L hip surgery as well as R partial achilles tear. He complains of his R knee feeling occasionally unstable if he tries to pivot too quickly. Otherwise ROS is negative for flags.    Limitations Other (comment)   running, deep squat   Diagnostic tests See history    Patient Stated Goals Decrease pain and return to full function with running and soccer    Currently in Pain? No/denies    Pain Score 0-No pain   Worst: 2/10   Pain Location Knee    Pain Orientation Right;Medial    Pain Descriptors / Indicators Stabbing;Other (Comment)   unstable   Pain Type Chronic pain    Pain Radiating Towards None    Pain Onset More than a month ago    Pain Frequency Intermittent    Aggravating Factors  Running, pivoting on leg, cutting, deep squat,    Pain Relieving Factors ice, knee brace    Effect of Pain on Daily Activities limiting his ability to run and play soccer  [facility] PT Assessment - 01/02/21 1928       Assessment   Medical Diagnosis right patellofemoral syndrome, internal derangement of multiple sites of right knee    Referring Provider (PT) Dr. Ashley Royalty    Onset Date/Surgical Date 10/08/20   Approximate   Hand Dominance Right    Next MD Visit Not reported    Prior Therapy None for this issue      Precautions   Precautions None      Restrictions   Weight Bearing Restrictions No                   SUBJECTIVE Chief complaint:  R medial knee pain Onset: Patient with anteromedial knee pain since traumatic onset 10/2020 where he was kicked in the medial right knee while playing a soccer game. He had immediate pain at the time of  the injury. He played later that week and the pain wasn't debilitating but he knew he had injured his knee. He did not notice any bruising or swelling but does report some painless clicking in his knee. He has been able to continue jogging/running since the injury but notices pain when he tries to cut laterally. He saw Dr. Ashley Royalty and had plain film radiographs on 12/17/20 which based on independent interpretation by Dr. Ashley Royalty showed maintained articular space tricompartmentally, lateral deviation of the patella noted on AP view, this corrects on [place] view, minimal cortical irregularity at the medial femoral condyle on sunrise view. Dr. Ashley Royalty expressed some concern about possible medial meniscus pathology as well as maltracking of his patella. He was advised to wear a lateral J hinged stabilizing brace which he has been wearing for the last week and has noticed some improvement in his knee stiffness after prolonged sitting. He was also advised to continue his colchicine for its anti-inflammatory effects. Pt with a remote history of L hip surgery as well as R partial achilles tear. He complains of his R knee feeling occasionally unstable if he tries to pivot too quickly. Otherwise ROS is negative for flags.     Referring Dx: right patellofemoral syndrome, internal derangement of multiple sites of right knee MD: Dr. Ashley Royalty Follow-up appt with MD: Not reported Pain: 0/10 Present, 0/10 Best, 2/10 Worst: Aggravating factors: Running, pivoting on leg, cutting, deep squat, Easing factors: icing, knee brace  24 hour pain behavior: more painful in the morning Knee surgery: No Recent knee trauma: Yes Prior history of knee injury or pain: Yes, remote R knee prepatellar bursitis Pain location: Medial knee joint line Pain quality: stabbing and leaves a feeling of instability Radiating symptoms: No  Numbness/Tingling: No Dominant hand: right Imaging: Yes, see history;    OBJECTIVE  MUSCULOSKELETAL: Tremor: Absent Bulk: Normal Tone: Normal, no spasticity, rigidity, or clonus No trophic changes noted to lower extremities. No ecchymosis, erythema, or edema noted around  rightknee. No gross knee deformity noted  Posture Full assessment deferred but no gross functional deficits noted in sitting or standing   Lumbar/Hip AROM: Lumbar WFL and painless with overpressure in all planes. Pt reports some anterior groin pain during FADIR position but no reproduction of knee pain  Gait Minimal arches in nonweightbearing and further flattening noted in standing and with gait. Moderate bilateral pronation noted. Pt reports having tried multiple inserts over time.  Palpation Pain to palpation along medial joint line when strumming over MCL. No pain at lateral joint line of knee. No pain over patellar tendon. No pain with palpation to quadriceps or hamstrings.  Strength R/L 5/5 Hip flexion 5/5 Hip external rotation 5/5 Hip internal rotation 5/5 Hip extension  4+/4+ Hip abduction 4+/4+ Hip adduction 5/5 Knee extension 4+/4+ Knee flexion 5/5 Ankle Dorsiflexion *indicates pain  AROM  Knee R/L Flexion: 135  Extension: 0 *indicates pain  Hip R/L Flexion: WNL, pain-free FADIR and FABER positions demonstrate full mobility of R hip with anterior groin pain during FADIR position but no reproduction of knee pain *indicates pain  Ankle R/L Ankle Dorsiflexion: 18*/34, anterior TC joint line pain reported on the right at end range dorsiflexion;  *Indicates Pain  Muscle Length Hamstring length: At least 90 degrees bilaterally; Quad length Michela Pitcher): Deferred Hip Flexor length Maisie Fus): Deferred IT band length Claiborne Rigg): Deferred  Passive Accessory Motion Superior Tibiofibular Joint: WNL Knee: WNL, pain-free Patella: WNL, pain-free Ankle: Deferred  NEUROLOGICAL:  Mental Status Patient is oriented to person, place and time.  Recent memory is intact.   Remote memory is intact.  Attention span and concentration are intact.  Expressive speech is intact.  Patient's fund of knowledge is within normal limits for educational level.  Sensation Deferred  Reflexes Deferred  VASCULAR Deferred   SPECIAL TESTS R knee: Ligamentous Stability  Anterior Drawer Test: Negative Lachman Test: Negative Pivot-Shift Test: Negative Posterior Drawer Test: Negative Posterior Sag Sign: Negative Valgus Stress Test: Positive for medial knee pain Varus Stress Test: Negative  Meniscus Tests McMurray: Positive for pain with both medial and lateral testing Thessaly Test: Negative Ege's: Negative      Objective measurements completed on examination: See above findings.                PT Education - 01/02/21 1929     Education Details Plan of care    Person(s) Educated Patient    Methods Explanation    Comprehension Verbalized understanding              PT Short Term Goals - 01/02/21 2122       PT SHORT TERM GOAL #1   Title Pt will be independent with HEP in order to increase strength and decrease L knee pain in order to improve function at home and with leisure activities such as running and soccer.    Time 4    Period Weeks    Status New    Target Date 01/28/21               PT Long Term Goals - 01/02/21 2122       PT LONG TERM GOAL #1   Title Pt will increase LEFS by at least 9 points in order to demonstrate significant improvement in lower extremity function related to R knee pain    Time 8    Period Weeks    Status New    Target Date 02/25/21      PT LONG TERM GOAL #2   Title Pt will report no further pain in R medial knee in order to demonstrate clinically significant reduction in knee pain    Baseline 01/02/21: Worst: 2/10    Time 8    Period Weeks    Status New    Target Date 02/25/21      PT LONG TERM GOAL #3   Title Pt will report at least 90% improvement in his symptoms in order to perform  cutting while running and return to playing soccer without increase in his pain    Time 8    Period Weeks    Status New    Target  Date 02/25/21                    Plan - 01/02/21 1930     Clinical Impression Statement Pt is a pleasant 30 year-old male referred for medial R knee pain. Pt reports pain to palpation along medial joint line when strumming over MCL. No pain at lateral joint line of knee. No pain over patellar tendon. No pain with palpation to quadriceps or hamstrings. No ligamentous laxity appreciated however pt reports medial knee pain during Valgus Stress Test. Positive McMurray with both medial and lateral testing however negative Thessaly and Ege's. Concern for possible meniscal injury and/or MCL sprain. Pt also demonstrates significant deficits in R ankle dorsiflexion range of motion in weightbearing compared to LLE presumably from prior partial achilles tendon tear. Pt will benefit from PT services to address deficits in R knee pain in order to return to full function at home, running, and soccer with less knee pain.    Personal Factors and Comorbidities Comorbidity 2    Comorbidities L hip arthroscopy, R achilles tear, myopericarditis    Examination-Activity Limitations Other   running   Examination-Participation Restrictions Community Activity;Other   playing sports   Stability/Clinical Decision Making Stable/Uncomplicated    Clinical Decision Making Low    Rehab Potential Excellent    PT Frequency 2x / week    PT Duration 8 weeks    PT Treatment/Interventions ADLs/Self Care Home Management;Biofeedback;Aquatic Therapy;Canalith Repostioning;Cryotherapy;Electrical Stimulation;Iontophoresis 4mg /ml Dexamethasone;Moist Heat;Traction;Ultrasound;Stair training;Gait training;DME Instruction;Therapeutic activities;Therapeutic exercise;Balance training;Neuromuscular re-education;Patient/family education;Manual techniques;Passive range of motion;Dry  needling;Taping;Vestibular;Spinal Manipulations;Joint Manipulations    PT Next Visit Plan Assess deep squat, assess step down, initiate strengthening and HEP    PT Home Exercise Plan None currently    Consulted and Agree with Plan of Care Patient             Patient will benefit from skilled therapeutic intervention in order to improve the following deficits and impairments:  Pain  Visit Diagnosis: Acute pain of right knee     Problem List Patient Active Problem List   Diagnosis Date Noted   Right patellofemoral syndrome 12/24/2020   Internal derangement of multiple sites of right knee 12/24/2020   Need for immunization against influenza 12/24/2020   Community acquired pneumonia of left lower lobe of lung 12/01/2020   Elevated troponin 12/01/2020   Myopericarditis 12/01/2020   Pleuritic chest pain 12/01/2020   12/03/2020 PT, DPT, GCS  Kallum Jorgensen, PT 01/02/2021, 9:45 PM  [facility] [address] [place], [place], [place] Phone: [phone]   Fax:  [phone]  Name: Beryl Balz MRN: Pati Gallo Date of Birth: [birth year]

## 2021-01-03 ENCOUNTER — Telehealth: Payer: Self-pay | Admitting: Family Medicine

## 2021-01-03 NOTE — Telephone Encounter (Signed)
Pt is calling to report the current insurance is valid [employer] [id number] Subscriber [id number]  Pt has two different card with a different responsibilities  [employer] Primary- $35   Pt will bring the new card when he comes to the office.

## 2021-01-05 ENCOUNTER — Ambulatory Visit: Payer: BC Managed Care – PPO

## 2021-01-07 ENCOUNTER — Other Ambulatory Visit: Payer: Self-pay

## 2021-01-07 ENCOUNTER — Ambulatory Visit: Payer: BC Managed Care – PPO

## 2021-01-07 DIAGNOSIS — M25561 Pain in right knee: Secondary | ICD-10-CM

## 2021-01-07 NOTE — Patient Instructions (Signed)
Access Code: [email or web address] URL: [email or web address] Date: 01/07/2021 Prepared by: Ria Comment  Exercises Supine Straight Leg Hip Adduction and Quad Set with Newman Pies - 1 x daily - 7 x weekly - 2 sets - 10 reps - 3s hold Sidelying Hip Abduction - 1 x daily - 7 x weekly - 2 sets - 10 reps - 3s hold Standing Bilateral Gastroc Stretch with Step - 2 x daily - 7 x weekly - 3 reps - 45-60s hold Standing Soleus Stretch (Mirrored) - 2 x daily - 7 x weekly - 3 reps - 45-60s hold

## 2021-01-07 NOTE — Therapy (Signed)
[facility] [address] [place], [place], [address] Phone: [phone]   Fax:  [phone]  Physical Therapy Treatment  Patient Details  Name: Cory Hamilton MRN: [id number] Date of Birth: April 14, 1990 Referring Provider (PT): Dr. Ashley Royalty   Encounter Date: 01/07/2021   PT End of Session - 01/07/21 1530     Visit Number 2    Number of Visits 17    Date for PT Re-Evaluation 02/25/21    Authorization Type eval: 12/31/20    PT Start Time 0800    PT Stop Time 0845    PT Time Calculation (min) 45 min    Activity Tolerance Patient tolerated treatment well;No increased pain    Behavior During Therapy WFL for tasks assessed/performed             Past Medical History:  Diagnosis Date   NSTEMI (non-ST elevated myocardial infarction) [facility])     Past Surgical History:  Procedure Laterality Date   HIP ARTHROSCOPY Left 2016    There were no vitals filed for this visit.   Subjective Assessment - 01/07/21 1527     Subjective Pt reports that he is doing well today. He denies any resting knee pain upon arrival and reports some improvement in his medial knee pain since the initial evaluation. He continues with lateral knee pain and occasional clicking. Pt has continued to run and states that he has been using his knee brace which has helped. No specific questions upon arrival.    Pertinent History Patient with anteromedial knee pain since traumatic onset 10/2020 where he was kicked in the medial right knee while playing a soccer game. He had immediate pain at the time of the injury. He played later that week and the pain wasn't debilitating but he knew he had injured his knee. He did not notice any bruising or swelling but does report some painless clicking in his knee. He has been able to continue jogging/running since the injury but notices pain when he tries to cut laterally. He saw Dr. Ashley Royalty and had plain film radiographs on  12/17/20 which based on independent interpretation by Dr. Ashley Royalty showed maintained articular space tricompartmentally, lateral deviation of the patella noted on AP view, this corrects on [place] view, minimal cortical irregularity at the medial femoral condyle on sunrise view. Dr. Ashley Royalty expressed some concern about possible medial meniscus pathology as well as maltracking of his patella. He was advised to wear a lateral J hinged stabilizing brace which he has been wearing for the last week and has noticed some improvement in his knee stiffness after prolonged sitting. He was also advised to continue his colchicine for its anti-inflammatory effects. Pt with a remote history of L hip surgery as well as R partial achilles tear. He complains of his R knee feeling occasionally unstable if he tries to pivot too quickly. Otherwise ROS is negative for flags.    Limitations Other (comment)   running, deep squat   Diagnostic tests See history    Patient Stated Goals Decrease pain and return to full function with running and soccer                 TREATMENT   Ther-ex  SciFit L4 x 5 minutes for warm-up during history; Deep squat and single leg step down assessment with slow motion video capture;  Step bilateral gastroc stretch 60s x 2; Prostretch bilateral calf x 60s; R Soleus stretch 2 x 60s; Supine R quad sets  with concurrent adductor isometric ball squeeze 5s hold x 10; L sidelying R hip SLR x 10; HEP issued with handout and education about how to perform;   Manual Therapy  IASTM to lateral R quad with fanning and sweeping strokes as well as effleurage with intermittent trigger point release x 8 minutes;   Trigger Point Dry Needling (TDN), unbilled Education performed with patient regarding potential benefit of TDN. Reviewed precautions and risks with patient. Pt provided verbal consent to treatment. Using clean technique with pt in supine TDN performed R to vastus lateralis and rectus  femoris with 2, 0.25 x 60 single needle placements with local twitch response (LTR). Pistoning technique utilized.    Pt educated throughout session about proper posture and technique with exercises. Improved exercise technique, movement at target joints, use of target muscles after min to mod verbal, visual, tactile cues.    Pt demonstrates excellent motivation during session today. Utilized slow motion video capture to observe deep squat and single leg eccentric step down. During deep squat pt demonstrates decreased bilateral ankle dorsiflexion limiting depth of squat which is corrected when heels are raised. He does appear to favor RLE during squat but also demonstrates more R ankle pronation and mild R knee valgus. During single leg step down pt demonstrates considerably worse single leg stability on RLE with notable Trendelenberg, R knee valgus, and R ankle pronation. Initiated gastroc and soleus stretches during session today and added to HEP. Also initiated quadricep and hip abduction strengthening which were also added to HEP. Performed trigger point dry needling to vastus lateralis and rectus femoris with twitch response. Pt encouraged to initiate HEP and follow-up as scheduled. Pt will benefit from PT services to address deficits in strength, motor control, and pain in order to return to full function at home, work, and with leisure activities such as soccer and running.                       PT Short Term Goals - 01/02/21 2122       PT SHORT TERM GOAL #1   Title Pt will be independent with HEP in order to increase strength and decrease L knee pain in order to improve function at home and with leisure activities such as running and soccer.    Time 4    Period Weeks    Status New    Target Date 01/28/21               PT Long Term Goals - 01/07/21 1531       PT LONG TERM GOAL #1   Title Pt will increase LEFS by at least 9 points in order to demonstrate  significant improvement in lower extremity function related to R knee pain    Baseline 12/31/20: 71/80    Time 8    Period Weeks    Status New    Target Date 02/25/21      PT LONG TERM GOAL #2   Title Pt will report no further pain in R medial knee in order to demonstrate clinically significant reduction in knee pain    Baseline 01/02/21: Worst: 2/10    Time 8    Period Weeks    Status New    Target Date 02/25/21      PT LONG TERM GOAL #3   Title Pt will report at least 90% improvement in his symptoms in order to perform cutting while running and return to playing soccer without  increase in his pain    Time 8    Period Weeks    Status New    Target Date 02/25/21                   Plan - 01/07/21 0800     Clinical Impression Statement Pt demonstrates excellent motivation during session today. Utilized slow motion video capture to observe deep squat and single leg eccentric step down. During deep squat pt demonstrates decreased bilateral ankle dorsiflexion limiting depth of squat which is corrected when heels are raised. He does appear to favor RLE during squat but also demonstrates more R ankle pronation and mild R knee valgus. During single leg step down pt demonstrates considerably worse single leg stability on RLE with notable Trendelenberg, R knee valgus, and R ankle pronation. Initiated gastroc and soleus stretches during session today and added to HEP. Also initiated quadricep and hip abduction strengthening which were also added to HEP. Performed trigger point dry needling to vastus lateralis and rectus femoris with twitch response. Pt encouraged to initiate HEP and follow-up as scheduled. Pt will benefit from PT services to address deficits in strength, motor control, and pain in order to return to full function at home, work, and with leisure activities such as soccer and running.    Personal Factors and Comorbidities Comorbidity 2    Comorbidities L hip arthroscopy, R  achilles tear, myopericarditis    Examination-Activity Limitations Other   running   Examination-Participation Restrictions Community Activity;Other   playing sports   Stability/Clinical Decision Making Stable/Uncomplicated    Rehab Potential Excellent    PT Frequency 2x / week    PT Duration 8 weeks    PT Treatment/Interventions ADLs/Self Care Home Management;Biofeedback;Aquatic Therapy;Canalith Repostioning;Cryotherapy;Electrical Stimulation;Iontophoresis 4mg /ml Dexamethasone;Moist Heat;Traction;Ultrasound;Stair training;Gait training;DME Instruction;Therapeutic activities;Therapeutic exercise;Balance training;Neuromuscular re-education;Patient/family education;Manual techniques;Passive range of motion;Dry needling;Taping;Vestibular;Spinal Manipulations;Joint Manipulations    PT Next Visit Plan review HEP, continue with quad, HS, and hip strengthening as well as ankle mobility (stretches, mobilizations);    PT Home Exercise Plan Access Code: [email or web address]    Consulted and Agree with Plan of Care Patient             Patient will benefit from skilled therapeutic intervention in order to improve the following deficits and impairments:  Pain  Visit Diagnosis: Acute pain of right knee     Problem List Patient Active Problem List   Diagnosis Date Noted   Right patellofemoral syndrome 12/24/2020   Internal derangement of multiple sites of right knee 12/24/2020   Need for immunization against influenza 12/24/2020   Community acquired pneumonia of left lower lobe of lung 12/01/2020   Elevated troponin 12/01/2020   Myopericarditis 12/01/2020   Pleuritic chest pain 12/01/2020   12/03/2020 PT, DPT, GCS  Hubert Derstine, PT 01/07/2021, 3:46 PM  [facility] [address] [place], [place], [place] Phone: [phone]   Fax:  [phone]  Name: Leotha Voeltz MRN: Pati Gallo Date of Birth: Dec 16, 1990

## 2021-01-12 ENCOUNTER — Ambulatory Visit: Payer: BC Managed Care – PPO | Attending: Family Medicine

## 2021-01-12 ENCOUNTER — Other Ambulatory Visit: Payer: Self-pay

## 2021-01-12 DIAGNOSIS — M222X1 Patellofemoral disorders, right knee: Secondary | ICD-10-CM | POA: Diagnosis not present

## 2021-01-12 DIAGNOSIS — M25561 Pain in right knee: Secondary | ICD-10-CM | POA: Insufficient documentation

## 2021-01-12 NOTE — Therapy (Signed)
[facility] [address] [place], [place], [address] Phone: [phone]   Fax:  [phone]  Physical Therapy Treatment  Patient Details  Name: Cory Hamilton MRN: [id number] Date of Birth: November 11, 1990 Referring Provider (PT): Dr. Ashley Royalty   Encounter Date: 01/12/2021   PT End of Session - 01/12/21 0855     Visit Number 3    Number of Visits 17    Date for PT Re-Evaluation 02/25/21    Authorization Type eval: 12/31/20    PT Start Time 0850    Activity Tolerance Patient tolerated treatment well;No increased pain    Behavior During Therapy WFL for tasks assessed/performed             Past Medical History:  Diagnosis Date   NSTEMI (non-ST elevated myocardial infarction) [facility])     Past Surgical History:  Procedure Laterality Date   HIP ARTHROSCOPY Left 2016    There were no vitals filed for this visit.   Subjective Assessment - 01/12/21 0853     Subjective pt reports no pain this morning. Pt ran this morning and has some minor soreness. No new concerns and complaints overall.    Currently in Pain? No/denies            there.ex:  Sci Fit level 5: 5 min for LE warm up.  6" step gastroc stretch: 2x60 secs, reports no medial R knee pain R Soleus stretch: 2x60 secs Supine R quad sets with concurrent adductor isometric ball squeeze 2x 5s hold x 10 L side lying resisted hip abduction with RTB: 2x10 Resisted monster walks: 2x6 lengths of ladder, good neutral knee alignment maintaining athletic stance throughout. Single leg dips for glut med activation (bilat): 2x12, use of finger for balance support due to difficulty stabilizing.  Gastroc pro stretch: 1x60 sec    Pt educated on monster walks and resisted hip abduction with RTB with reps, sets, frequency.    PT Education - 01/12/21 0854     Education Details form/technique with exercise.    Person(s) Educated Patient    Methods  Explanation;Demonstration;Tactile cues;Verbal cues    Comprehension Verbalized understanding;Returned demonstration;Verbal cues required              PT Short Term Goals - 01/02/21 2122       PT SHORT TERM GOAL #1   Title Pt will be independent with HEP in order to increase strength and decrease L knee pain in order to improve function at home and with leisure activities such as running and soccer.    Time 4    Period Weeks    Status New    Target Date 01/28/21               PT Long Term Goals - 01/07/21 1531       PT LONG TERM GOAL #1   Title Pt will increase LEFS by at least 9 points in order to demonstrate significant improvement in lower extremity function related to R knee pain    Baseline 12/31/20: 71/80    Time 8    Period Weeks    Status New    Target Date 02/25/21      PT LONG TERM GOAL #2   Title Pt will report no further pain in R medial knee in order to demonstrate clinically significant reduction in knee pain    Baseline 01/02/21: Worst: 2/10    Time 8    Period Weeks    Status  New    Target Date 02/25/21      PT LONG TERM GOAL #3   Title Pt will report at least 90% improvement in his symptoms in order to perform cutting while running and return to playing soccer without increase in his pain    Time 8    Period Weeks    Status New    Target Date 02/25/21                   Plan - 01/12/21 0908     Clinical Impression Statement Pt continues to display great motivation throughout session. Pt tolerating progression of hip stability and RLE strengthening without an increase in pain. Continues to display minor R knee pain with deep knee flexion and gastroc stretching but improves after exercises.With single LE hip dips pt displays decreased stability on R versus L requiring single UE support. Pt quick to rpeort fatigue in lateral hip musculature with progresison however no pain. Pt will continue to benefit from skilled PT treatment to further  progress strength to return to PLOF.    Personal Factors and Comorbidities Comorbidity 2    Comorbidities L hip arthroscopy, R achilles tear, myopericarditis    Examination-Activity Limitations Other   running   Examination-Participation Restrictions Community Activity;Other   playing sports   Stability/Clinical Decision Making Stable/Uncomplicated    Rehab Potential Excellent    PT Frequency 2x / week    PT Duration 8 weeks    PT Treatment/Interventions ADLs/Self Care Home Management;Biofeedback;Aquatic Therapy;Canalith Repostioning;Cryotherapy;Electrical Stimulation;Iontophoresis 4mg /ml Dexamethasone;Moist Heat;Traction;Ultrasound;Stair training;Gait training;DME Instruction;Therapeutic activities;Therapeutic exercise;Balance training;Neuromuscular re-education;Patient/family education;Manual techniques;Passive range of motion;Dry needling;Taping;Vestibular;Spinal Manipulations;Joint Manipulations    PT Next Visit Plan review HEP, continue with quad, HS, and hip strengthening as well as ankle mobility (stretches, mobilizations);    PT Home Exercise Plan Access Code: [email or web address]    Consulted and Agree with Plan of Care Patient             Patient will benefit from skilled therapeutic intervention in order to improve the following deficits and impairments:  Pain  Visit Diagnosis: Acute pain of right knee     Problem List Patient Active Problem List   Diagnosis Date Noted   Right patellofemoral syndrome 12/24/2020   Internal derangement of multiple sites of right knee 12/24/2020   Need for immunization against influenza 12/24/2020   Community acquired pneumonia of left lower lobe of lung 12/01/2020   Elevated troponin 12/01/2020   Myopericarditis 12/01/2020   Pleuritic chest pain 12/01/2020    12/03/2020. Fairly IV, PT, DPT Physical Therapist- [place]  [facility]  01/12/2021, 9:47 AM  [place] [facility] [address] [place], [place], [place] Phone: [phone]   Fax:  [phone]  Name: Simcha Speir MRN: Pati Gallo Date of Birth: 02-25-1991

## 2021-01-14 ENCOUNTER — Ambulatory Visit: Payer: BC Managed Care – PPO | Admitting: Physical Therapy

## 2021-01-14 ENCOUNTER — Other Ambulatory Visit: Payer: Self-pay

## 2021-01-14 ENCOUNTER — Encounter: Payer: Self-pay | Admitting: Physical Therapy

## 2021-01-14 DIAGNOSIS — M222X1 Patellofemoral disorders, right knee: Secondary | ICD-10-CM

## 2021-01-14 DIAGNOSIS — M25561 Pain in right knee: Secondary | ICD-10-CM | POA: Diagnosis not present

## 2021-01-14 NOTE — Therapy (Signed)
[facility] [address] [place], [place], [address] Phone: [phone]   Fax:  [phone]  Physical Therapy Treatment  Patient Details  Name: Cory Hamilton MRN: [id number] Date of Birth: 18-Dec-1990 Referring Provider (PT): Dr. Ashley Royalty   Encounter Date: 01/14/2021   Treatment: 4 of 17.  Recert date: 02/25/2021 0801 to 0847    Past Medical History:  Diagnosis Date   NSTEMI (non-ST elevated myocardial infarction) [facility])     Past Surgical History:  Procedure Laterality Date   HIP ARTHROSCOPY Left 2016    There were no vitals filed for this visit.   Pt. states he is doing well. No c/o knee pain prior to PT tx. session. Good compliance and understanding of HEP.    Patient with anteromedial knee pain since traumatic onset 10/2020 where he was kicked in the medial right knee while playing a soccer game. He had immediate pain at the time of the injury. He played later that week and the pain wasn't debilitating but he knew he had injured his knee. He did not notice any bruising or swelling but does report some painless clicking in his knee. He has been able to continue jogging/running since the injury but notices pain when he tries to cut laterally. He saw Dr. Ashley Royalty and had plain film radiographs on 12/17/20 which based on independent interpretation by Dr. Ashley Royalty showed maintained articular space tricompartmentally, lateral deviation of the patella noted on AP view, this corrects on [place] view, minimal cortical irregularity at the medial femoral condyle on sunrise view. Dr. Ashley Royalty expressed some concern about possible medial meniscus pathology as well as maltracking of his patella. He was advised to wear a lateral J hinged stabilizing brace which he has been wearing for the last week and has noticed some improvement in his knee stiffness after prolonged sitting. He was also advised to continue his colchicine for its  anti-inflammatory effects. Pt with a remote history of L hip surgery as well as R partial achilles tear. He complains of his R knee feeling occasionally unstable if he tries to pivot too quickly. Otherwise ROS is negative for flags.      There.ex:   Sci Fit level 7: 5 min forward/backwards for LE warm up.  2nd step knee flexion/ hamstring and gastroc stretches with static holds 3x each.  Discussed keeping R knee in midline position.     Resisted gait 110#: all 4-planes (forward with lunges/ cuing for R knee midline).    Supine R quad sets with concurrent adductor isometric ball squeeze 2x 5s hold x 10  L side lying resisted hip abduction with RTB: 2x10  Discussed resisted monster walks: lengths of ladder, good neutral knee alignment maintaining athletic stance throughout.  TG squats: midline/toe in/ toe out 10x2 each.  TG single leg squats 10x2 each (good technique/no pain).       Discussed use of orthotics/proper shoe wear to promote knee position.       PT Short Term Goals - 01/02/21 2122       PT SHORT TERM GOAL #1   Title Pt will be independent with HEP in order to increase strength and decrease L knee pain in order to improve function at home and with leisure activities such as running and soccer.    Time 4    Period Weeks    Status New    Target Date 01/28/21  PT Long Term Goals - 01/07/21 1531       PT LONG TERM GOAL #1   Title Pt will increase LEFS by at least 9 points in order to demonstrate significant improvement in lower extremity function related to R knee pain    Baseline 12/31/20: 71/80    Time 8    Period Weeks    Status New    Target Date 02/25/21      PT LONG TERM GOAL #2   Title Pt will report no further pain in R medial knee in order to demonstrate clinically significant reduction in knee pain    Baseline 01/02/21: Worst: 2/10    Time 8    Period Weeks    Status New    Target Date 02/25/21      PT LONG TERM GOAL #3   Title  Pt will report at least 90% improvement in his symptoms in order to perform cutting while running and return to playing soccer without increase in his pain    Time 8    Period Weeks    Status New    Target Date 02/25/21               PT tx. session focused on LE/quad stability with proper technique. No c/o knee pain during tx. session and progression to higher level LE stabilization ex. PT discussed benefits of proper R shoe orthotics/ arch support to promote arch support during wt. bearing/ running ex. No change to HEP and pt. will continue. Pt. will continue to benefit from skilled PT services to increase quad/LE stability to prevent pain with running/ sports.      Patient will benefit from skilled therapeutic intervention in order to improve the following deficits and impairments:  Pain  Visit Diagnosis: Acute pain of right knee  Right patellofemoral syndrome     Problem List Patient Active Problem List   Diagnosis Date Noted   Right patellofemoral syndrome 12/24/2020   Internal derangement of multiple sites of right knee 12/24/2020   Need for immunization against influenza 12/24/2020   Community acquired pneumonia of left lower lobe of lung 12/01/2020   Elevated troponin 12/01/2020   Myopericarditis 12/01/2020   Pleuritic chest pain 12/01/2020   [name], PT, DPT # [phone] 01/15/2021, 4:59 PM  [facility] [address] [place], [place], [address] Phone: [phone]   Fax:  [phone]  Name: Cory Hamilton MRN: [id number] Date of Birth: 27-Sep-1990

## 2021-01-19 ENCOUNTER — Ambulatory Visit: Payer: BC Managed Care – PPO | Admitting: Cardiovascular Disease

## 2021-01-19 ENCOUNTER — Other Ambulatory Visit: Payer: Self-pay

## 2021-01-19 ENCOUNTER — Encounter: Payer: Self-pay | Admitting: Cardiovascular Disease

## 2021-01-19 VITALS — BP 118/78 | HR 68 | Ht 74.0 in | Wt 204.2 lb

## 2021-01-19 DIAGNOSIS — R079 Chest pain, unspecified: Secondary | ICD-10-CM | POA: Diagnosis not present

## 2021-01-19 DIAGNOSIS — R778 Other specified abnormalities of plasma proteins: Secondary | ICD-10-CM

## 2021-01-19 DIAGNOSIS — I319 Disease of pericardium, unspecified: Secondary | ICD-10-CM

## 2021-01-19 NOTE — Progress Notes (Signed)
Cardiology Office Note  Date:  01/19/2021   ID:  Cory Hamilton, DOB 1990/04/30, MRN [id number]  PCP:  Cory Banana, MD   Chief Complaint  Patient presents with   New Patient (Initial Visit)    Ref by Dr. Ashley Hamilton for myopericarditis.  "Doing well." Medications reviewed by the patient verbally.     HPI:  [name] is a 30 year old gentleman  Professor at [employer] Referred by primary care Dr. Joseph Hamilton for consultation of his myopericarditis  Seen at [facility] November 30, 2020 for chest pain, possible pneumonia, elevated troponin around 100 Reported having chest left arm left upper back pain admitted to [facility] on 11/30/2020, chest pain, elevated troponin  elevated inflammatory markers CRP in particular her Chest pain was positional and deep inspiration given antibiotics, started on colchicine 0.6 mg twice daily, and was discharged on 12/01/2020.   completed 5 days of antibiotics after discharge from hospital,  Symptoms resolve shortly after discharge,   CT scan while at [facility]  left lower lung field consolidation  Peak troponin of 113, series of additional checks, essentially none trending ranging between 92 and 113/24. CRP 14 sed rate 3  Echocardiogram December 01, 2020 normal ejection fraction, no pericardial thickening or effusion Aortic root 3.7 cm  Ekg at [facility] NORMAL SINUS RHYTHM  NORMAL ECG  NO PREVIOUS ECGS AVAILABLE   EKG personally reviewed by myself on todays visit Normal sinus rhythm rate 67 bpm no significant ST-T wave changes  PMH:   has a past medical history of NSTEMI (non-ST elevated myocardial infarction) (HCC).  PSH:    Past Surgical History:  Procedure Laterality Date   HIP ARTHROSCOPY Left 2016    Current Outpatient Medications  Medication Sig Dispense Refill   fexofenadine (ALLEGRA) 180 MG tablet Take 180 mg by mouth daily.     colchicine 0.6 MG tablet Take 1 tablet (0.6 mg total) by mouth 2 (two) times daily as needed (chest  pain). 60 tablet 1   ibuprofen (ADVIL) 600 MG tablet Take 600 mg by mouth every 6 (six) hours as needed. (Patient not taking: No sig reported)     No current facility-administered medications for this visit.    Allergies:   Patient has no known allergies.   Social History:  The patient  reports that he has never smoked. He has never used smokeless tobacco. He reports current alcohol use of about 9.0 standard drinks per week. He reports that he does not use drugs.   Family History:   family history includes Alzheimer's disease in his paternal grandmother; Cancer in his maternal grandmother; Stroke in his maternal grandfather.    Review of Systems: Review of Systems  Constitutional: Negative.   HENT: Negative.    Respiratory: Negative.    Cardiovascular: Negative.   Gastrointestinal: Negative.   Musculoskeletal: Negative.   Neurological: Negative.   Psychiatric/Behavioral: Negative.    All other systems reviewed and are negative.   PHYSICAL EXAM: VS:  BP 118/78 (BP Location: Right Arm, Patient Position: Sitting, Cuff Size: Normal)   Pulse 68   Ht 6\' 2"  (1.88 m)   Wt 204 lb 4 oz (92.6 kg)   SpO2 98%   BMI 26.22 kg/m  , BMI Body mass index is 26.22 kg/m. GEN: Well nourished, well developed, in no acute distress HEENT: normal Neck: no JVD, carotid bruits, or masses Cardiac: RRR; no murmurs, rubs, or gallops,no edema  Respiratory:  clear to auscultation bilaterally, normal work of breathing GI: soft, nontender,  nondistended, + BS MS: no deformity or atrophy Skin: warm and dry, no rash Neuro:  Strength and sensation are intact Psych: euthymic mood, full affect   Recent Labs: No results found for requested labs within last 8760 hours.    Lipid Panel No results found for: CHOL, HDL, LDLCALC, TRIG    Wt Readings from Last 3 Encounters:  01/19/21 204 lb 4 oz (92.6 kg)  12/24/20 202 lb (91.6 kg)  12/17/20 203 lb (92.1 kg)    ASSESSMENT AND PLAN:  Problem List Items  Addressed This Visit       Cardiology Problems   Myopericarditis - Primary   Relevant Medications   colchicine 0.6 MG tablet   Other Relevant Orders   EKG 12-Lead     Other   Elevated troponin   Relevant Orders   EKG 12-Lead   Other Visit Diagnoses     Chest pain of uncertain etiology       Relevant Orders   EKG 12-Lead      Myopericarditis Records reviewed from [facility], has made a full recovery Continues on colchicine twice a day, has started exercise Etiology unclear, vague elevation of inflammatory markers in the setting of his symptoms in August 2022 No further cardiac work-up needed Recommended wean off the colchicine in the next week or 2 Okay to restart low grade exercise as he is doing with titration upwards as tolerated -For recurrent symptoms may need to restart NSAIDs with colchicine Suggested he call our office for recurrent symptoms Would likely not need to repeat his cardiac work-up  Elevated troponin In the setting of above, this was not a non-STEMI Elevated troponin in the setting of inflammatory process No ischemic work-up needed   Total encounter time more than 60 minutes  Greater than 50% was spent in counseling and coordination of care with the patient  Patient seen in consultation for Dr. Joseph Hamilton will be referred back to his office for ongoing care of the issues detailed above   Signed, Dossie Arbour, M.D., Ph.D. [facility] [place], [place] [phone]

## 2021-01-19 NOTE — Patient Instructions (Addendum)
Medication Instructions:   Ok to wean off the colchicine For chest pain, call the office,  Ok to restart NSAIDS/colchicine  If you need a refill on your cardiac medications before your next appointment, please call your pharmacy.   Lab work: No new labs needed  Testing/Procedures: No new testing needed  Follow-Up: At [facility], you and your health needs are our priority.  As part of our continuing mission to provide you with exceptional heart care, we have created designated Provider Care Teams.  These Care Teams include your primary Cardiologist (physician) and Advanced Practice Providers (APPs -  Physician Assistants and Nurse Practitioners) who all work together to provide you with the care you need, when you need it.  You will need a follow up appointment as needed  Providers on your designated Care Team:   Nicolasa Ducking, NP Eula Listen, PA-C Marisue Ivan, PA-C [place], [place]  COVID-19 Vaccine Information can be found at: [email or web address] For questions related to vaccine distribution or appointments, please email vaccine@[email or web address] [email or web address] or call [phone].   col

## 2021-01-20 ENCOUNTER — Other Ambulatory Visit: Payer: Self-pay | Admitting: Family Medicine

## 2021-01-20 ENCOUNTER — Other Ambulatory Visit: Payer: Self-pay

## 2021-01-20 DIAGNOSIS — R946 Abnormal results of thyroid function studies: Secondary | ICD-10-CM

## 2021-01-21 DIAGNOSIS — R946 Abnormal results of thyroid function studies: Secondary | ICD-10-CM | POA: Diagnosis not present

## 2021-01-21 DIAGNOSIS — R768 Other specified abnormal immunological findings in serum: Secondary | ICD-10-CM | POA: Insufficient documentation

## 2021-01-21 DIAGNOSIS — I319 Disease of pericardium, unspecified: Secondary | ICD-10-CM | POA: Diagnosis not present

## 2021-01-21 DIAGNOSIS — J90 Pleural effusion, not elsewhere classified: Secondary | ICD-10-CM | POA: Diagnosis not present

## 2021-01-21 HISTORY — DX: Other specified abnormal immunological findings in serum: R76.8

## 2021-01-22 LAB — TSH+T4F+T3FREE
Free T4: 1.31 ng/dL (ref 0.82–1.77)
T3, Free: 3.5 pg/mL (ref 2.0–4.4)
TSH: 2.51 u[IU]/mL (ref 0.450–4.500)

## 2021-01-24 ENCOUNTER — Other Ambulatory Visit: Payer: Self-pay

## 2021-01-24 ENCOUNTER — Ambulatory Visit: Payer: BC Managed Care – PPO

## 2021-01-24 DIAGNOSIS — M222X1 Patellofemoral disorders, right knee: Secondary | ICD-10-CM | POA: Diagnosis not present

## 2021-01-24 DIAGNOSIS — M25561 Pain in right knee: Secondary | ICD-10-CM | POA: Diagnosis not present

## 2021-01-24 NOTE — Therapy (Signed)
[facility] [address]. [place], [place], [address] Phone: [phone]   Fax:  [phone]  Physical Therapy Treatment  Patient Details  Name: Cory Hamilton MRN: [id number] Date of Birth: [birth year] Referring Provider (PT): Dr. Ashley Royalty   Encounter Date: 01/24/2021   PT End of Session - 01/24/21 0807     Visit Number 5    Number of Visits 17    Date for PT Re-Evaluation 02/25/21    Authorization Type eval: 12/31/20    Authorization - Visit Number 5    Authorization - Number of Visits 10    PT Start Time 0801    PT Stop Time 0845    PT Time Calculation (min) 44 min    Activity Tolerance Patient tolerated treatment well;No increased pain    Behavior During Therapy WFL for tasks assessed/performed             Past Medical History:  Diagnosis Date   NSTEMI (non-ST elevated myocardial infarction) [facility])     Past Surgical History:  Procedure Laterality Date   HIP ARTHROSCOPY Left 2016    There were no vitals filed for this visit.   Subjective Assessment - 01/24/21 0806     Subjective Pt. states he is doing well.  No c/o knee pain prior to PT tx. session. Knee has not been painful during his runs. Good compliance and understanding of HEP.  His main issue at this point is right lower extremity strength.    Pertinent History Patient with anteromedial knee pain since traumatic onset 10/2020 where he was kicked in the medial right knee while playing a soccer game. He had immediate pain at the time of the injury. He played later that week and the pain wasn't debilitating but he knew he had injured his knee. He did not notice any bruising or swelling but does report some painless clicking in his knee. He has been able to continue jogging/running since the injury but notices pain when he tries to cut laterally. He saw Dr. Ashley Royalty and had plain film radiographs on 12/17/20 which based on independent interpretation by Dr.  Ashley Royalty showed maintained articular space tricompartmentally, lateral deviation of the patella noted on AP view, this corrects on Mountain view, minimal cortical irregularity at the medial femoral condyle on sunrise view. Dr. Ashley Royalty expressed some concern about possible medial meniscus pathology as well as maltracking of his patella. He was advised to wear a lateral J hinged stabilizing brace which he has been wearing for the last week and has noticed some improvement in his knee stiffness after prolonged sitting. He was also advised to continue his colchicine for its anti-inflammatory effects. Pt with a remote history of L hip surgery as well as R partial achilles tear. He complains of his R knee feeling occasionally unstable if he tries to pivot too quickly. Otherwise ROS is negative for flags.    Diagnostic tests See history    Patient Stated Goals Decrease pain and return to full function with running and soccer               TREATMENT     Ther-ex  SciFit L4 x 5 minutes for warm-up during history; Step bilateral gastroc stretch x 60s, no pain; R Soleus stretch x 60s, no pain; Second step knee flexion/contralateral hip flexor stretch x 60s each; Total Gym level 22 single leg squats 2 x 10 BLE, utilized green tband for medial pull on R side to improve activation  of R hip external rotators; Green tband resisted side stepping 30' x 4 lengths; Green tband monster walks 30' x 4 lengths; Supine R SLR 3# ankle weight (AW) toe up x 10, in position of hip ER x 10; L sidelying R hip SLR with 3# AW x 10;     Pt educated throughout session about proper posture and technique with exercises. Improved exercise technique, movement at target joints, use of target muscles after min to mod verbal, visual, tactile cues.      Pt demonstrates excellent motivation during session today. Pt reports 40% improvement since starting with therapy however he denies any further pain and this is just related to  his persistent RLE weakness. Continued with right gastroc and soleus stretches during session today. Also continued with right lower extremity strengthening focusing on isolated strength such as single-leg squats on the Total Gym.  Patient denies any pain throughout entire session.  HEP progressed on this date and frequency decreased to once per week.  Pt encouraged to continue HEP and follow-up as scheduled. Pt will benefit from PT services to address deficits in strength, motor control, and pain in order to return to full function at home, work, and with leisure activities such as soccer and running.                             PT Short Term Goals - 01/02/21 2122       PT SHORT TERM GOAL #1   Title Pt will be independent with HEP in order to increase strength and decrease L knee pain in order to improve function at home and with leisure activities such as running and soccer.    Time 4    Period Weeks    Status New    Target Date 01/28/21               PT Long Term Goals - 01/07/21 1531       PT LONG TERM GOAL #1   Title Pt will increase LEFS by at least 9 points in order to demonstrate significant improvement in lower extremity function related to R knee pain    Baseline 12/31/20: 71/80    Time 8    Period Weeks    Status New    Target Date 02/25/21      PT LONG TERM GOAL #2   Title Pt will report no further pain in R medial knee in order to demonstrate clinically significant reduction in knee pain    Baseline 01/02/21: Worst: 2/10    Time 8    Period Weeks    Status New    Target Date 02/25/21      PT LONG TERM GOAL #3   Title Pt will report at least 90% improvement in his symptoms in order to perform cutting while running and return to playing soccer without increase in his pain    Time 8    Period Weeks    Status New    Target Date 02/25/21                   Plan - 01/24/21 0808     Clinical Impression Statement Pt demonstrates  excellent motivation during session today. Pt reports 40% improvement since starting with therapy however he denies any further pain and this is just related to his persistent RLE weakness. Continued with right gastroc and soleus stretches during session today. Also continued with right  lower extremity strengthening focusing on isolated strength such as single-leg squats on the Total Gym.  Patient denies any pain throughout entire session.  HEP progressed on this date and frequency decreased to once per week.  Pt encouraged to continue HEP and follow-up as scheduled. Pt will benefit from PT services to address deficits in strength, motor control, and pain in order to return to full function at home, work, and with leisure activities such as soccer and running.    Personal Factors and Comorbidities Comorbidity 2    Comorbidities L hip arthroscopy, R achilles tear, myopericarditis    Examination-Activity Limitations Other   running   Examination-Participation Restrictions Community Activity;Other   playing sports   Stability/Clinical Decision Making Stable/Uncomplicated    Rehab Potential Excellent    PT Frequency 2x / week    PT Duration 8 weeks    PT Treatment/Interventions ADLs/Self Care Home Management;Biofeedback;Aquatic Therapy;Canalith Repostioning;Cryotherapy;Electrical Stimulation;Iontophoresis 4mg /ml Dexamethasone;Moist Heat;Traction;Ultrasound;Stair training;Gait training;DME Instruction;Therapeutic activities;Therapeutic exercise;Balance training;Neuromuscular re-education;Patient/family education;Manual techniques;Passive range of motion;Dry needling;Taping;Vestibular;Spinal Manipulations;Joint Manipulations    PT Next Visit Plan review HEP, continue with quad, HS, and hip strengthening as well as ankle mobility (stretches, mobilizations);    PT Home Exercise Plan Access Code: [email or web address]    Consulted and Agree with Plan of Care Patient             Patient will benefit from skilled  therapeutic intervention in order to improve the following deficits and impairments:  Pain  Visit Diagnosis: Acute pain of right knee  Right patellofemoral syndrome     Problem List Patient Active Problem List   Diagnosis Date Noted   Right patellofemoral syndrome 12/24/2020   Internal derangement of multiple sites of right knee 12/24/2020   Need for immunization against influenza 12/24/2020   Community acquired pneumonia of left lower lobe of lung 12/01/2020   Elevated troponin 12/01/2020   Myopericarditis 12/01/2020   Pleuritic chest pain 12/01/2020   12/03/2020 PT, DPT, GCS  Yacob Wilkerson, PT 01/24/2021, 10:28 AM  [facility] [address]. [place], [place], [place] Phone: [phone]   Fax:  [phone]  Name: Cory Hamilton MRN: Pati Gallo Date of Birth: 1990-05-22

## 2021-01-25 ENCOUNTER — Encounter: Payer: Self-pay | Admitting: Family Medicine

## 2021-01-26 ENCOUNTER — Ambulatory Visit
Admission: RE | Admit: 2021-01-26 | Discharge: 2021-01-26 | Disposition: A | Discharge status: Home / Self Care | Payer: BC Managed Care – PPO | Source: Ambulatory Visit | Attending: Family Medicine | Admitting: Family Medicine

## 2021-01-26 ENCOUNTER — Ambulatory Visit
Admission: RE | Admit: 2021-01-26 | Discharge: 2021-01-26 | Disposition: A | Discharge status: Home / Self Care | Payer: BC Managed Care – PPO | Attending: Family Medicine | Admitting: Family Medicine

## 2021-01-26 ENCOUNTER — Other Ambulatory Visit: Payer: Self-pay

## 2021-01-26 DIAGNOSIS — J189 Pneumonia, unspecified organism: Secondary | ICD-10-CM | POA: Insufficient documentation

## 2021-01-31 ENCOUNTER — Ambulatory Visit: Payer: BC Managed Care – PPO

## 2021-01-31 ENCOUNTER — Institutional Professional Consult (permissible substitution): Payer: BC Managed Care – PPO | Admitting: Internal Medicine

## 2021-02-02 ENCOUNTER — Encounter: Payer: Self-pay | Admitting: Family Medicine

## 2021-02-02 ENCOUNTER — Ambulatory Visit: Payer: BC Managed Care – PPO | Admitting: Family Medicine

## 2021-02-02 ENCOUNTER — Other Ambulatory Visit: Payer: Self-pay

## 2021-02-02 VITALS — BP 122/78 | HR 80 | Temp 98.3°F | Ht 74.0 in | Wt 207.0 lb

## 2021-02-02 DIAGNOSIS — M222X1 Patellofemoral disorders, right knee: Secondary | ICD-10-CM | POA: Diagnosis not present

## 2021-02-02 DIAGNOSIS — M2391 Unspecified internal derangement of right knee: Secondary | ICD-10-CM | POA: Diagnosis not present

## 2021-02-02 DIAGNOSIS — I319 Disease of pericardium, unspecified: Secondary | ICD-10-CM | POA: Diagnosis not present

## 2021-02-02 NOTE — Patient Instructions (Signed)
-   Continue home-based physical therapy for the right knee - Can utilize over-the-counter Voltaren gel (diclofenac 1%) on as-needed basis for knee pain - Continue knee brace while running until symptoms resolve - Can consider orthotics if symptoms persist - Contact our office for any heart or lung symptoms - Return for follow-up on an as-needed basis if the symptoms persist into the new year

## 2021-02-03 NOTE — Assessment & Plan Note (Signed)
See additional assessment(s) for plan details. 

## 2021-02-03 NOTE — Progress Notes (Signed)
Primary Care / Sports Medicine Office Visit  Patient Information:  Patient ID: Cory Hamilton, male DOB: 09/27/1990 Age: 30 y.o. MRN: [id number]   Cory Hamilton is a pleasant 30 y.o. male presenting with the following:  Chief Complaint  Patient presents with   Follow-up    6 weeks   Right patellofemoral syndrome    X-Ray 12/17/20; following with PT; 1/10 pain   Community acquired pneumonia of left lower lobe of lung    Repeat chest X-Ray 01/26/21; asymptomatic   NSTEMI    Following with cardiology, determined there was no underlying cause for symptoms; taking colchicine as needed for chest pain   Positive ANA    Saw rheumatology and it was determined result may have been a false positive    Review of Systems pertinent details above   Patient Active Problem List   Diagnosis Date Noted   ANA positive 01/21/2021   Right patellofemoral syndrome 12/24/2020   Internal derangement of multiple sites of right knee 12/24/2020   Need for immunization against influenza 12/24/2020   Community acquired pneumonia of left lower lobe of lung 12/01/2020   Elevated troponin 12/01/2020   Myopericarditis 12/01/2020   Pleuritic chest pain 12/01/2020   Past Medical History:  Diagnosis Date   Allergy    NSTEMI (non-ST elevated myocardial infarction) [facility])    Outpatient Encounter Medications as of 02/02/2021  Medication Sig   fexofenadine (ALLEGRA) 180 MG tablet Take 180 mg by mouth daily.   colchicine 0.6 MG tablet Take 1 tablet (0.6 mg total) by mouth 2 (two) times daily as needed (chest pain). (Patient not taking: Reported on 02/02/2021)   [DISCONTINUED] ibuprofen (ADVIL) 600 MG tablet Take 600 mg by mouth every 6 (six) hours as needed. (Patient not taking: No sig reported)   No facility-administered encounter medications on file as of 02/02/2021.   Past Surgical History:  Procedure Laterality Date   HIP ARTHROSCOPY Left 2016    Vitals:   02/02/21 0824  BP: 122/78   Pulse: 80  Temp: 98.3 F (36.8 C)  SpO2: 96%   Vitals:   02/02/21 0824  Weight: 207 lb (93.9 kg)  Height: 6\' 2"  (1.88 m)   Body mass index is 26.58 kg/m.  DG Chest 2 View  Result Date: 01/26/2021 CLINICAL DATA:  Left-sided pneumonia. EXAM: CHEST - 2 VIEW COMPARISON:  November 30, 2020. FINDINGS: The heart size and mediastinal contours are within normal limits. Both lungs are clear. The visualized skeletal structures are unremarkable. IMPRESSION: No active cardiopulmonary disease. Electronically Signed   By: December 02, 2020 M.D.   On: 01/26/2021 15:24     Independent interpretation of notes and tests performed by another provider:   None  Procedures performed:   None  Pertinent History, Exam, Impression, and Recommendations:   Myopericarditis Hospitalization for the same, has weaned from colchicine and is remained asymptomatic despite gradual increase in physical activity.  He has recently seen cardiology who has provided reassurance.  Current etiology still unclear though given positive ANA, autoimmune component possible, could also consider sequela of viral infection given comorbid committee acquired pneumonia noted during inpatient admission.  From physical exam standpoint his cardiac and pulmonary findings are completely benign, recent chest x-ray reviewed and negative for acute or chronic processes.  Given patient's clinical course I have advised patient to monitor for any symptom recurrence, hold from follow-up with pulmonology, and can follow-up on an as-needed basis for this issue.  Right patellofemoral syndrome Patient  has noted slight improvement in her right knee symptoms following brace usage, physical therapy, and home exercises.  His examination today shows resolution of medial tibiofemoral joint line tenderness, patellar maltracking again noted and there is subtle tenderness at the facets, provocative testing now negative with McMurray's test.  I have advised  patient to continue with home-based rehab, consider OTC semirigid orthotics if symptoms persist over the next several weeks, and he can follow-up on as-needed basis for this issue.  If symptoms persist over the next few months despite adherence to the above, intra-articular injections, advanced imaging to all be considered.  Internal derangement of multiple sites of right knee See additional assessment(s) for plan details.   Orders & Medications No orders of the defined types were placed in this encounter.  No orders of the defined types were placed in this encounter.    Return if symptoms worsen or fail to improve.     Jerrol Banana, MD   Primary Care Sports Medicine [facility]

## 2021-02-03 NOTE — Assessment & Plan Note (Signed)
Hospitalization for the same, has weaned from colchicine and is remained asymptomatic despite gradual increase in physical activity.  He has recently seen cardiology who has provided reassurance.  Current etiology still unclear though given positive ANA, autoimmune component possible, could also consider sequela of viral infection given comorbid committee acquired pneumonia noted during inpatient admission.  From physical exam standpoint his cardiac and pulmonary findings are completely benign, recent chest x-ray reviewed and negative for acute or chronic processes.  Given patient's clinical course I have advised patient to monitor for any symptom recurrence, hold from follow-up with pulmonology, and can follow-up on an as-needed basis for this issue.

## 2021-02-03 NOTE — Assessment & Plan Note (Addendum)
>>  ASSESSMENT AND PLAN FOR RIGHT PATELLOFEMORAL SYNDROME WRITTEN ON 02/03/2021  5:09 PM BY Geronimo Diliberto J, MD  Patient has noted slight improvement in her right knee symptoms following brace usage, physical therapy, and home exercises.  His examination today shows resolution of medial tibiofemoral joint line tenderness, patellar maltracking again noted and there is subtle tenderness at the facets, provocative testing now negative with McMurray's test.  I have advised patient to continue with home-based rehab, consider OTC semirigid orthotics if symptoms persist over the next several weeks, and he can follow-up on as-needed basis for this issue.  If symptoms persist over the next few months despite adherence to the above, intra-articular injections, advanced imaging to all be considered.   >>ASSESSMENT AND PLAN FOR INTERNAL DERANGEMENT OF MULTIPLE SITES OF RIGHT KNEE WRITTEN ON 02/03/2021  5:09 PM BY Armilda Vanderlinden J, MD  See additional assessment(s) for plan details.

## 2021-02-04 ENCOUNTER — Ambulatory Visit: Payer: BC Managed Care – PPO | Admitting: Family Medicine

## 2021-02-04 ENCOUNTER — Other Ambulatory Visit: Payer: Self-pay

## 2021-02-04 ENCOUNTER — Ambulatory Visit: Payer: BC Managed Care – PPO

## 2021-02-04 DIAGNOSIS — M222X1 Patellofemoral disorders, right knee: Secondary | ICD-10-CM

## 2021-02-04 DIAGNOSIS — M25561 Pain in right knee: Secondary | ICD-10-CM

## 2021-02-04 NOTE — Therapy (Signed)
[facility] [address] [place], [place], [address] Phone: [phone]   Fax:  [phone]  Physical Therapy Treatment/Discharge  Patient Details  Name: Cory Hamilton MRN: [id number] Date of Birth: February 02, 1991 Referring Provider (PT): Dr. Zigmund Daniel   Encounter Date: 02/04/2021   PT End of Session - 02/04/21 0940     Visit Number 6    Number of Visits 17    Date for PT Re-Evaluation 02/25/21    Authorization Type eval: 12/31/20    Authorization - Visit Number 6    Authorization - Number of Visits 10    PT Start Time 0931    PT Stop Time 1025    PT Time Calculation (min) 54 min    Activity Tolerance Patient tolerated treatment well;No increased pain    Behavior During Therapy WFL for tasks assessed/performed              Past Medical History:  Diagnosis Date   Allergy    NSTEMI (non-ST elevated myocardial infarction) [facility])     Past Surgical History:  Procedure Laterality Date   HIP ARTHROSCOPY Left 2016    There were no vitals filed for this visit.   Subjective Assessment - 02/04/21 0930     Subjective Pt. states he is doing well today. No complaints of R knee pain prior to therapy session. He has not had any further knee pain over the last week. Overall he reports 100% improvement in his medial R knee pain and 80% improvement in his anterior R knee pain. Knee has not been painful during his runs but he does report some stiffness afterwards. He saw Dr. Rodena Piety who was pleased with his progress. Good compliance and understanding of HEP.    Pertinent History Patient with anteromedial knee pain since traumatic onset 10/2020 where he was kicked in the medial right knee while playing a soccer game. He had immediate pain at the time of the injury. He played later that week and the pain wasn't debilitating but he knew he had injured his knee. He did not notice any bruising or swelling but does report some painless  clicking in his knee. He has been able to continue jogging/running since the injury but notices pain when he tries to cut laterally. He saw Dr. Zigmund Daniel and had plain film radiographs on 12/17/20 which based on independent interpretation by Dr. Zigmund Daniel showed maintained articular space tricompartmentally, lateral deviation of the patella noted on AP view, this corrects on Rein[place] view, minimal cortical irregularity at the medial femoral condyle on sunrise view. Dr. Zigmund Daniel expressed some concern about possible medial meniscus pathology as well as maltracking of his patella. He was advised to wear a lateral J hinged stabilizing brace which he has been wearing for the last week and has noticed some improvement in his knee stiffness after prolonged sitting. He was also advised to continue his colchicine for its anti-inflammatory effects. Pt with a remote history of L hip surgery as well as R partial achilles tear. He complains of his R knee feeling occasionally unstable if he tries to pivot too quickly. Otherwise ROS is negative for flags.    Diagnostic tests See history    Patient Stated Goals Decrease pain and return to full function with running and soccer    Currently in Pain? No/denies               TREATMENT     Ther-ex  SciFit L4 x 5 minutes for warm-up  during history;  Updated outcome measures/goals: FOTO: 91 Percent Improvement: 100% improvement in medial knee pain, 80% improvement in anterior knee symptoms: Worst pain: 0/10; LEFS: 77/80;  Kinesiotape applied to R knee in general supportive pattern with two partially overlapping 1/2 strips over patellar tendon and 2 full strips from mid quad around medial and lateral borders of patella crossing bellow patellar tendons. Utilized 100% stretch on all pieces.  Total Gym level 22 single leg squats 2 x 10 BLE; Single leg heel taps from 6" step to 2" Airex pad 2 x 10 BLE; Single leg hip hikes on 6" step x 10 BLE; Black tband resisted  side stepping x 50'; Barefoot gait analysis with increased toe out and increased arch collapse/pronation on the R foot; Performed limited running analysis on treadmill with patient, pt runs with a heel-strike pattern at cadence of 160 spm. Good line of progression bilaterally with slight medial whip on RLE and increased speed of pronation from initial contact to pronation. Over striding noted bilaterally but more on the R side. Faster and more pronation noted in R foot. Discussed increasing cadence to 170-180 spm to decrease stride length and forces through anterior knee. Repeated run at 170 spm using metronome and pt demonstrates less aggressive heel strike contact with less tibial extension.       Pt educated throughout session about proper posture and technique with exercises. Improved exercise technique, movement at target joints, use of target muscles after min to mod verbal, visual, tactile cues.      Pt demonstrates excellent motivation during session today. Pt reports 100% improvement in his medial R knee pain since starting therapy and 80% improvement in his anterior R knee pain. He has not had any medial knee pain over the last week. His FOTO improved from 76 at initial evaluation to 91 today. His LEFS improved from 71/80 initially to 77/80 today. Gait and running analysis today with patient. Discussed increasing running cadence to decrease overstriding to minimize anterior knee forces. He might benefit from an OTC orthotic if he continues to have pain with running as well as plantar fascia pain during the day at work. Pt is struggling with knee brace slipping while running in leggings during cold weather. Applied kinesiotape to R knee in general supportive pattern with education about how he can perform at home. HEP progressed today to include additional closed chain hip abductor and quad strengthening. Pt will be discharged today have achieved his goals for therapy.                             PT Short Term Goals - 02/04/21 1043       PT SHORT TERM GOAL #1   Title Pt will be independent with HEP in order to increase strength and decrease L knee pain in order to improve function at home and with leisure activities such as running and soccer.    Time 4    Period Weeks    Status Achieved               PT Long Term Goals - 02/04/21 1044       PT LONG TERM GOAL #1   Title Pt will increase LEFS by at least 9 points in order to demonstrate significant improvement in lower extremity function related to R knee pain    Baseline 12/31/20: 71/80; 02/04/21: 77/80;    Time 8    Period Weeks  Status Partially Met      PT LONG TERM GOAL #2   Title Pt will report no further pain in R medial knee in order to demonstrate clinically significant reduction in knee pain    Baseline 01/02/21: Worst: 2/10; 02/04/21: 0/10;    Time 8    Period Weeks    Status Achieved      PT LONG TERM GOAL #3   Title Pt will report at least 90% improvement in his symptoms in order to perform cutting while running and return to playing soccer without increase in his pain    Baseline 02/04/21: 100% improvment in medial knee pain, 80% improvement in anterior knee pain;    Time 8    Period Weeks    Status Partially Met                   Plan - 02/04/21 0952     Clinical Impression Statement Pt demonstrates excellent motivation during session today. Pt reports 100% improvement in his medial R knee pain since starting therapy and 80% improvement in his anterior R knee pain. He has not had any medial knee pain over the last week. His FOTO improved from 76 at initial evaluation to 91 today. His LEFS improved from 71/80 initially to 77/80 today. Gait and running analysis today with patient. Discussed increasing running cadence to decrease overstriding to minimize anterior knee forces. He might benefit from an OTC orthotic if he continues to have pain  with running as well as plantar fascia pain during the day at work. Pt is struggling with knee brace slipping while running in leggings during cold weather. Applied kinesiotape to R knee in general supportive pattern with education about how he can perform at home. HEP progressed today to include additional closed chain hip abductor and quad strengthening. Pt will be discharged today have achieved his goals for therapy.    Personal Factors and Comorbidities Comorbidity 2    Comorbidities L hip arthroscopy, R achilles tear, myopericarditis    Examination-Activity Limitations Other   running   Examination-Participation Restrictions Community Activity;Other   playing sports   Stability/Clinical Decision Making Stable/Uncomplicated    Rehab Potential Excellent    PT Frequency 2x / week    PT Duration 8 weeks    PT Treatment/Interventions ADLs/Self Care Home Management;Biofeedback;Aquatic Therapy;Canalith Repostioning;Cryotherapy;Electrical Stimulation;Iontophoresis 30m/ml Dexamethasone;Moist Heat;Traction;Ultrasound;Stair training;Gait training;DME Instruction;Therapeutic activities;Therapeutic exercise;Balance training;Neuromuscular re-education;Patient/family education;Manual techniques;Passive range of motion;Dry needling;Taping;Vestibular;Spinal Manipulations;Joint Manipulations    PT Next Visit Plan Discharge    PT Home Exercise Plan Access Code: [email or web address]   Consulted and Agree with Plan of Care Patient              Patient will benefit from skilled therapeutic intervention in order to improve the following deficits and impairments:  Pain  Visit Diagnosis: Acute pain of right knee  Right patellofemoral syndrome     Problem List Patient Active Problem List   Diagnosis Date Noted   ANA positive 01/21/2021   Right patellofemoral syndrome 12/24/2020   Internal derangement of multiple sites of right knee 12/24/2020   Need for immunization against influenza 12/24/2020   Community  acquired pneumonia of left lower lobe of lung 12/01/2020   Elevated troponin 12/01/2020   Myopericarditis 12/01/2020   Pleuritic chest pain 12/01/2020   JPhillips GroutPT, DPT, GCS  Haneefah Venturini, PT 02/04/2021, 10:55 AM  [facility] [place] [address]Phone: [phone]  Fax:  [phone]  Name: Cory Hamilton MRN: [id number] Date of Birth: [birth year]

## 2021-02-04 NOTE — Patient Instructions (Signed)
Access Code: [email or web address] URL: [email or web address] Date: 02/04/2021 Prepared by: Ria Comment  Exercises Standing Bilateral Gastroc Stretch with Step - 2 x daily - 7 x weekly - 3 reps - 45-60s hold Standing Soleus Stretch (Mirrored) - 2 x daily - 7 x weekly - 3 reps - 45-60s hold Lateral Step Down (Mirrored) - 1 x daily - 3 x weekly - 2 sets - 10 reps - 3s hold Hip Hiking on Step - 1 x daily - 3 x weekly - 2 sets - 10 reps - 3s hold Side Stepping with Resistance at Ankles - 1 x daily - 3 x weekly - 3 reps - 60 seconds hold Plank with Hip Extension - 1 x daily - 3 x weekly - 2 sets - 10 reps - 3s hold Side Plank on Elbow with Hip Abduction (Mirrored) - 1 x daily - 3 x weekly - 2 sets - 10 reps - 3s hold

## 2021-02-28 ENCOUNTER — Ambulatory Visit: Payer: Self-pay

## 2021-02-28 ENCOUNTER — Encounter: Payer: Self-pay | Admitting: Family Medicine

## 2021-02-28 ENCOUNTER — Other Ambulatory Visit: Payer: Self-pay | Admitting: Family Medicine

## 2021-02-28 ENCOUNTER — Telehealth: Payer: BC Managed Care – PPO | Admitting: Family Medicine

## 2021-02-28 DIAGNOSIS — G8929 Other chronic pain: Secondary | ICD-10-CM

## 2021-02-28 MED ORDER — CYCLOBENZAPRINE HCL 5 MG PO TABS: 5.0000 mg | ORAL_TABLET | Freq: Every evening | ORAL | 0 refills | Status: DC | PRN

## 2021-02-28 NOTE — Telephone Encounter (Signed)
Pt c/o lower back pain with radiation to knees. Rates spasms 7/10. Pt stated he played soccer a few days ago and feels that is why his back is hurting. Pt has been taking Tylenol and Aleve with minimal effect.  Pt has had similar issue with back previously and stated muscle relaxer helped. Pt is traveling via care on Wednesday and hopes the muscle relaxer will help.  Care advice given and pt verbalized understanding.    Pt preferred virtual visit. Appt is today.       Reason for Disposition  Back pain is a chronic symptom (recurrent or ongoing AND present > 4 weeks)  Answer Assessment - Initial Assessment Questions 1. ONSET: "When did the pain begin?"      Yesterday am 2. LOCATION: "Where does it hurt?" (upper, mid or lower back)     lower 3. SEVERITY: "How bad is the pain?"  (e.g., Scale 1-10; mild, moderate, or severe)   - MILD (1-3): doesn't interfere with normal activities    - MODERATE (4-7): interferes with normal activities or awakens from sleep    - SEVERE (8-10): excruciating pain, unable to do any normal activities      Moderate 7/10 4. PATTERN: "Is the pain constant?" (e.g., yes, no; constant, intermittent)      yes 5. RADIATION: "Does the pain shoot into your legs or elsewhere?"     Down both legs to the knees 6. CAUSE:  "What do you think is causing the back pain?"      Unknown- played soccer 7. BACK OVERUSE:  "Any recent lifting of heavy objects, strenuous work or exercise?"     soccer 8. MEDICATIONS: "What have you taken so far for the pain?" (e.g., nothing, acetaminophen, NSAIDS)     Aleve and Tylenol mild relief 9. NEUROLOGIC SYMPTOMS: "Do you have any weakness, numbness, or problems with bowel/bladder control?"     no 10. OTHER SYMPTOMS: "Do you have any other symptoms?" (e.g., fever, abdominal pain, burning with urination, blood in urine)       no 11. PREGNANCY: "Is there any chance you are pregnant?" (e.g., yes, no; LMP)       N/a  Protocols used: Back  Pain-A-AH

## 2021-08-10 ENCOUNTER — Ambulatory Visit: Payer: Self-pay | Admitting: Medical

## 2021-08-10 ENCOUNTER — Encounter: Payer: Self-pay | Admitting: Medical

## 2021-08-10 VITALS — BP 120/82 | HR 59 | Temp 97.7°F | Resp 16

## 2021-08-10 DIAGNOSIS — J0100 Acute maxillary sinusitis, unspecified: Secondary | ICD-10-CM

## 2021-08-10 DIAGNOSIS — R6889 Other general symptoms and signs: Secondary | ICD-10-CM

## 2021-08-10 LAB — POCT INFLUENZA A/B
Influenza A, POC: NEGATIVE
Influenza B, POC: NEGATIVE

## 2021-08-10 MED ORDER — AMOXICILLIN-POT CLAVULANATE 875-125 MG PO TABS: 1.0000 | ORAL_TABLET | Freq: Two times a day (BID) | ORAL | 0 refills | Status: DC

## 2021-08-10 NOTE — Progress Notes (Signed)
? ?  Subjective:  ? ? Patient ID: Cory Hamilton, male    DOB: 1991/02/19, 31 y.o.   MRN: [id number] ? ?HPI ? ?31 yo in non acute distress, presents with nasal congestion , productive cough, sore throat, no fever or chills. ? ?Review of Systems  ?Constitutional:  Negative for chills and fever.  ?HENT:  Positive for congestion and sinus pain. Negative for ear pain.   ?Respiratory:  Positive for cough. Negative for shortness of breath and wheezing.   ?Cardiovascular:  Negative for chest pain.  ? ?   ?Objective:  ? Physical Exam ?Vitals and nursing note reviewed.  ?Constitutional:   ?   Appearance: Normal appearance.  ?HENT:  ?   Head: Normocephalic and atraumatic.  ?   Right Ear: Tympanic membrane, ear canal and external ear normal.  ?   Left Ear: Tympanic membrane, ear canal and external ear normal.  ?   Nose: Congestion present.  ?   Mouth/Throat:  ?   Mouth: Mucous membranes are dry.  ?   Pharynx: Oropharynx is clear.  ?Eyes:  ?   Extraocular Movements: Extraocular movements intact.  ?   Conjunctiva/sclera: Conjunctivae normal.  ?   Pupils: Pupils are equal, round, and reactive to light.  ?Cardiovascular:  ?   Rate and Rhythm: Normal rate and regular rhythm.  ?Pulmonary:  ?   Effort: Pulmonary effort is normal.  ?   Breath sounds: Normal breath sounds.  ?Musculoskeletal:     ?   General: Normal range of motion.  ?   Cervical back: Normal range of motion and neck supple.  ?Skin: ?   General: Skin is warm and dry.  ?Neurological:  ?   General: No focal deficit present.  ?   Mental Status: He is alert and oriented to person, place, and time.  ? ? ?Recent Results (from the past 2160 hour(s))  ?POCT Influenza A/B     Status: Normal  ? Collection Time: 08/10/21 11:20 AM  ?Result Value Ref Range  ? Influenza A, POC Negative Negative  ? Influenza B, POC Negative Negative  ?  ? ? ?   ?Assessment & Plan:  ? ?Encounter Diagnoses  ?Name Primary?  ? Flu-like symptoms Yes  ? Acute non-recurrent maxillary sinusitis   ? ?Meds  ordered this encounter  ?Medications  ? amoxicillin-clavulanate (AUGMENTIN) 875-125 MG tablet  ?  Sig: Take 1 tablet by mouth 2 (two) times daily.  ?  Dispense:  20 tablet  ?  Refill:  0  ? Follow up in 3-5 days if not improving. Patient verbalizes understanding and has no questions at discharge. ? ?

## 2021-08-22 ENCOUNTER — Encounter: Payer: Self-pay | Admitting: Medical

## 2021-08-22 NOTE — Patient Instructions (Signed)

## 2021-12-25 IMAGING — CR DG KNEE COMPLETE 4+V*R*
4 series · 4 of 4 positions shown · non-contrast
Comparison: None.

CLINICAL DATA: Pain, hit in the knee playing soccer

EXAM:
RIGHT KNEE - COMPLETE 4+ VIEW

[knee ap]
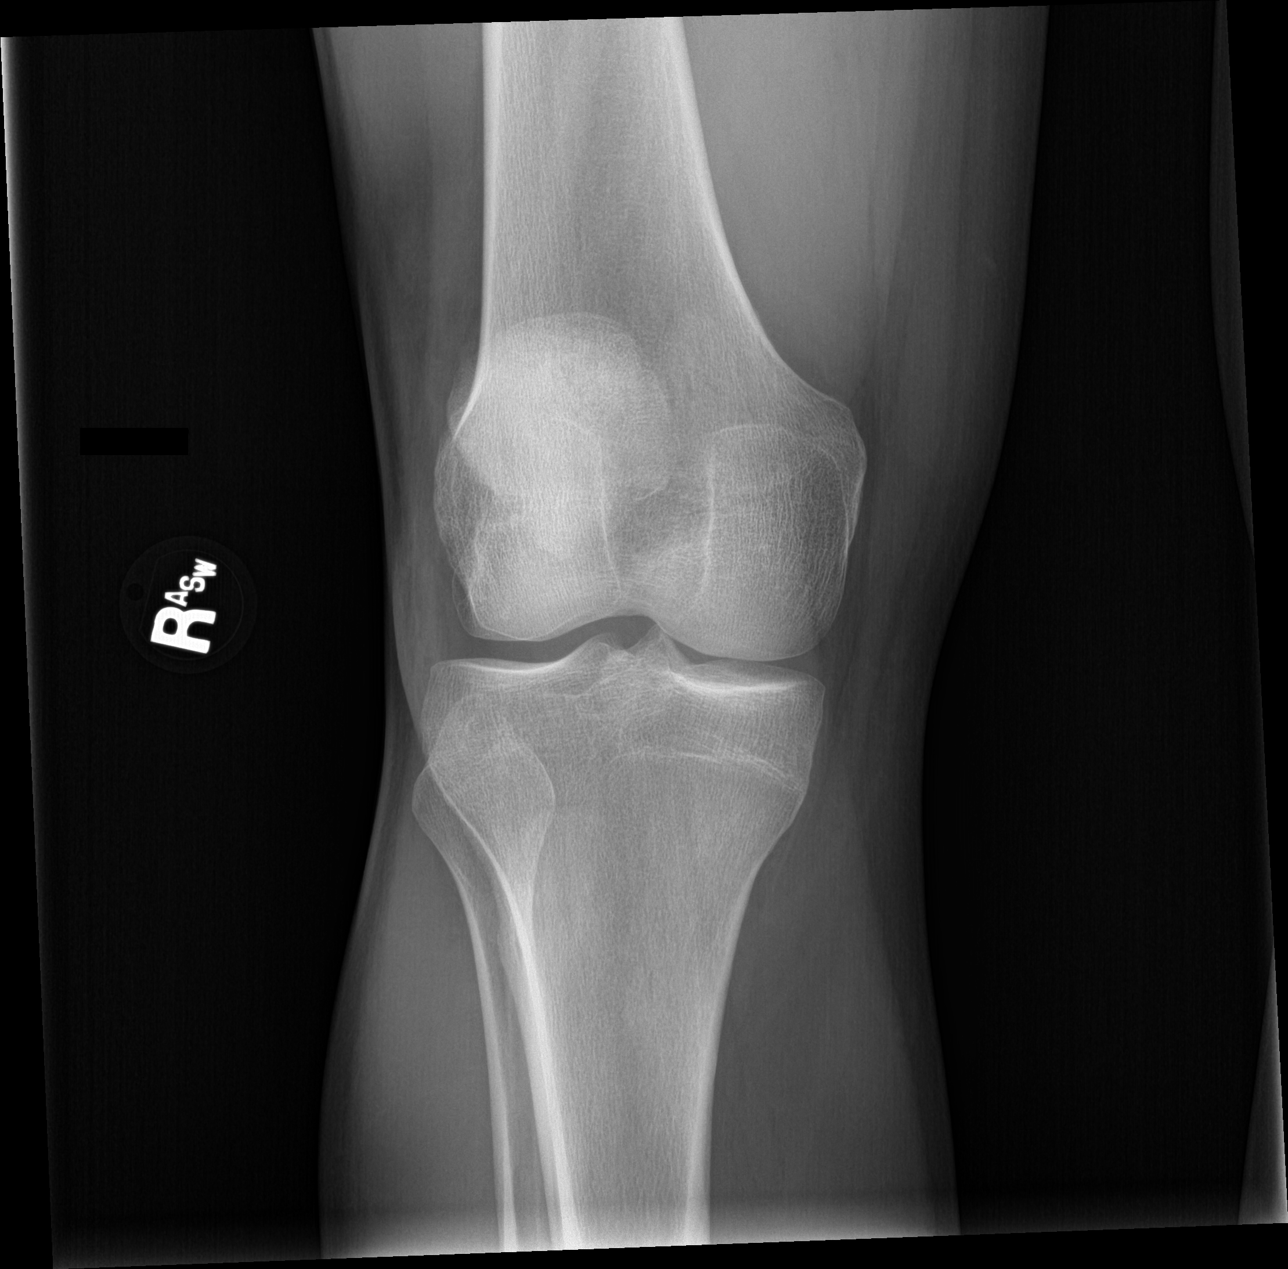

[knee lat]
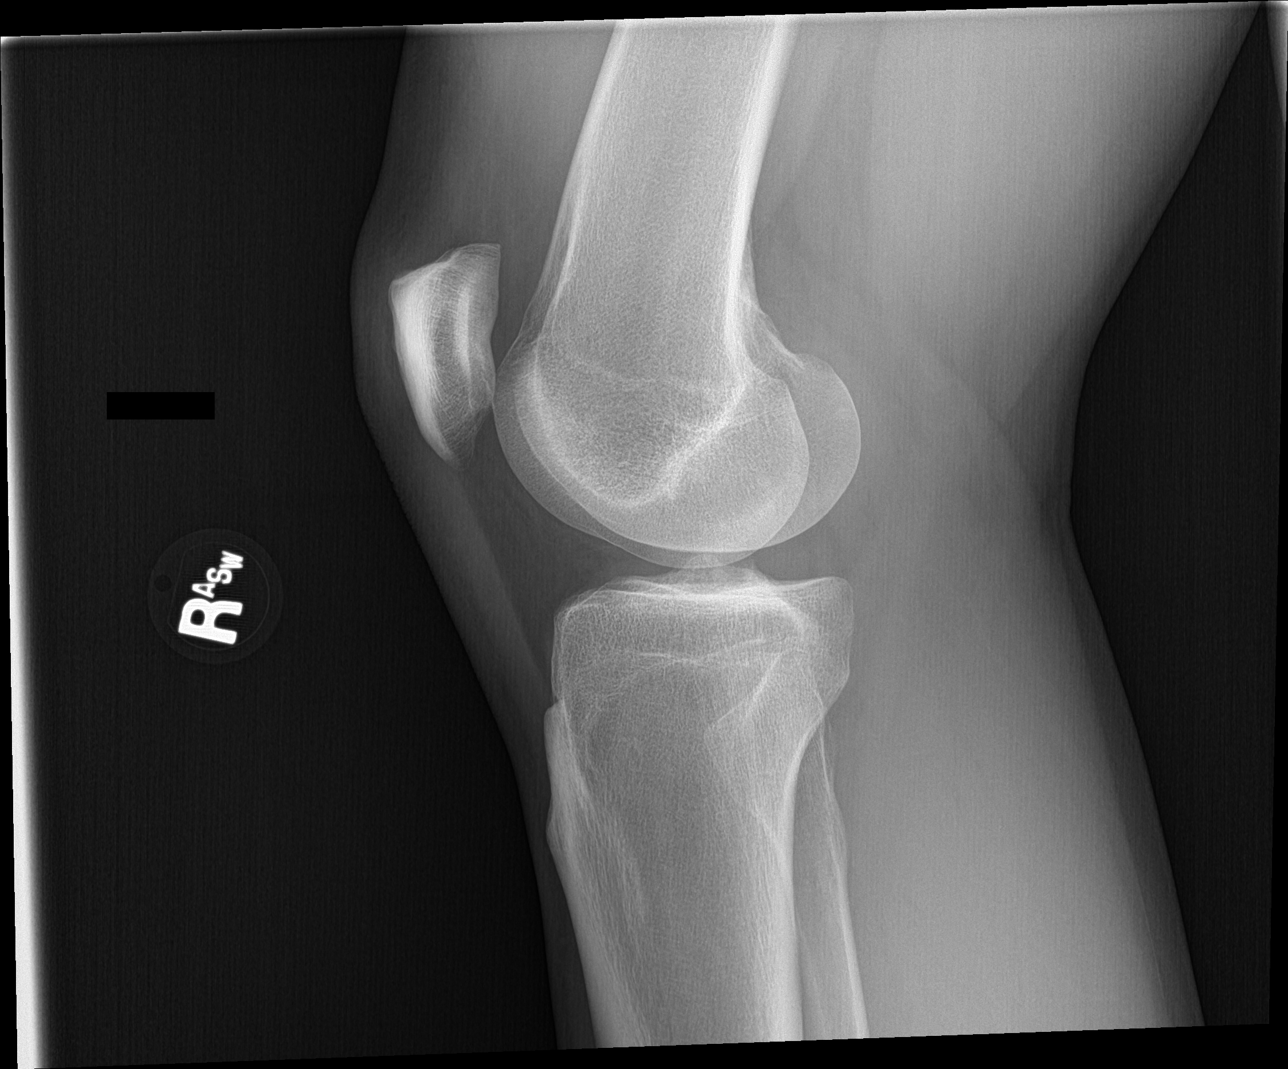

[tunnel]
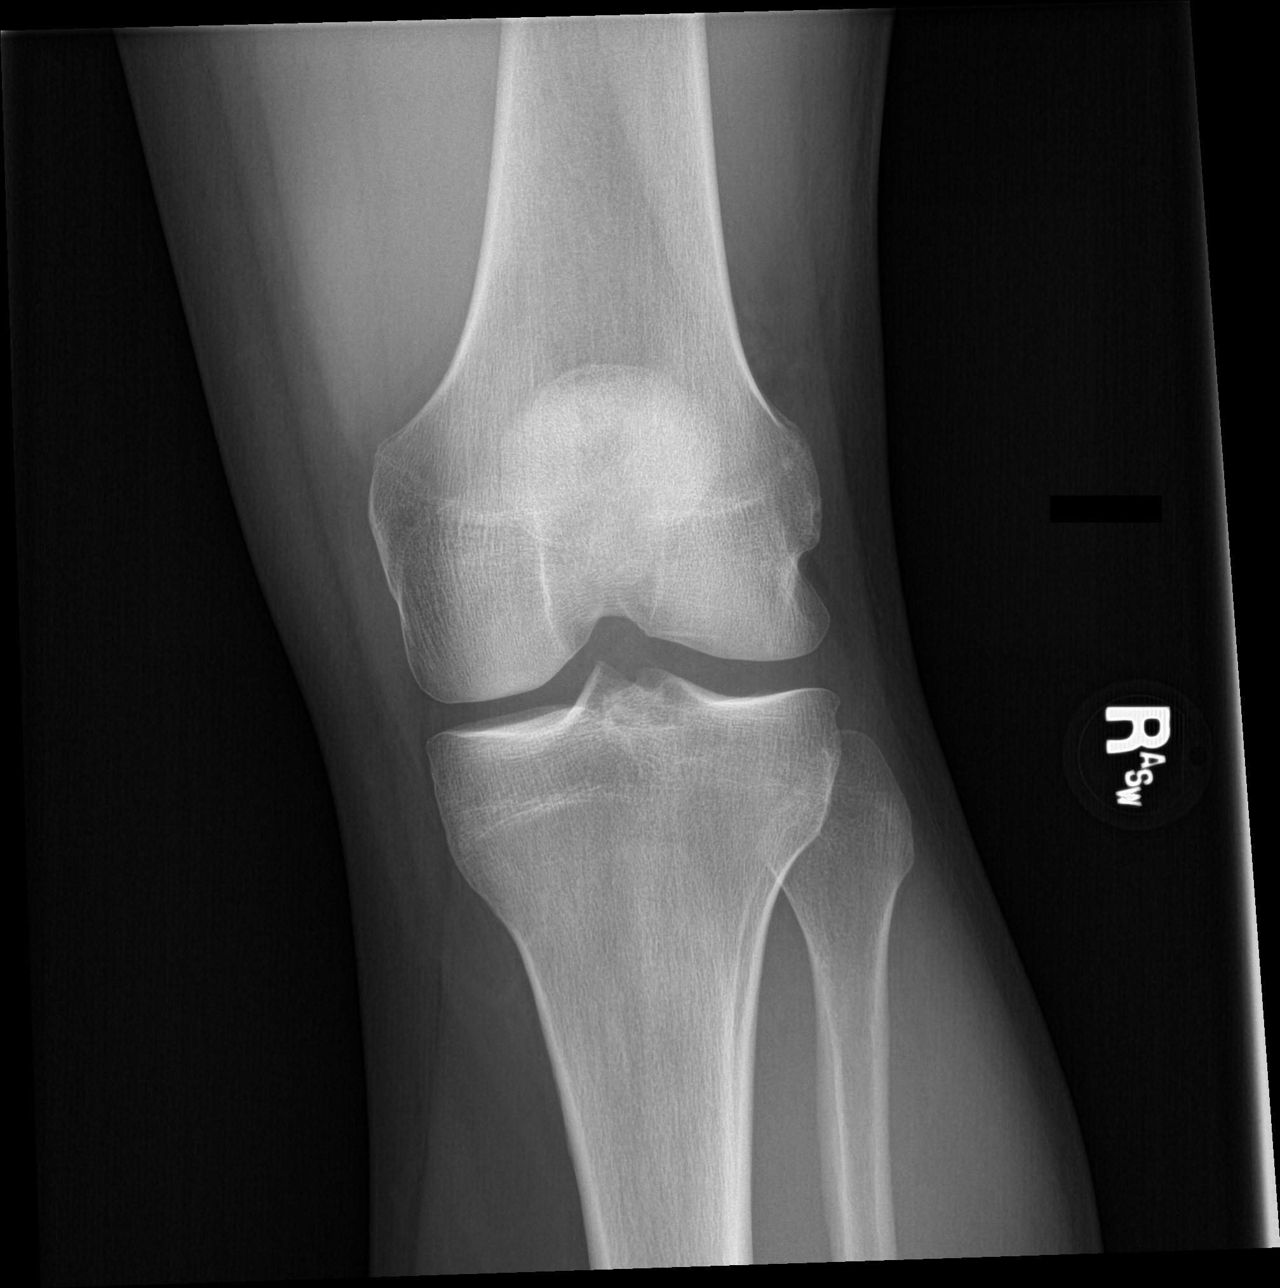

[patella skyline]
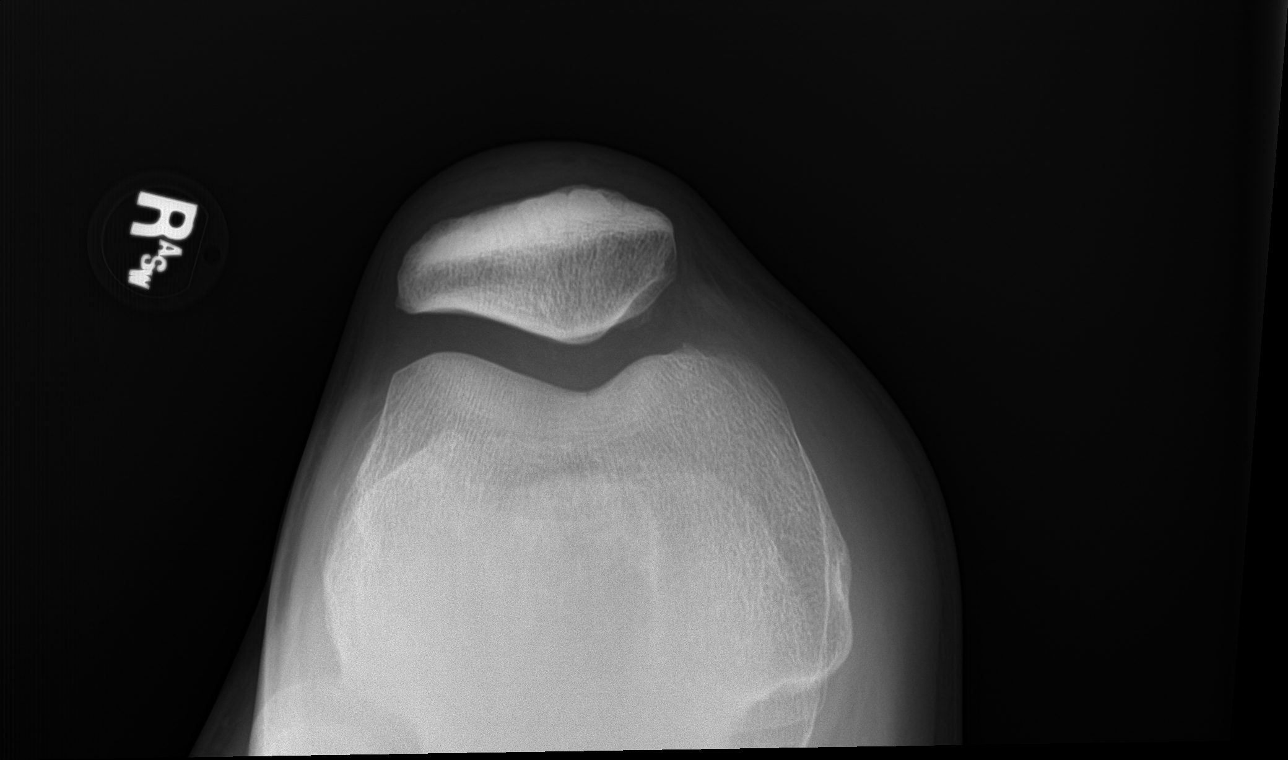

[4 of 4 positions shown; findings below may reference images not displayed]

FINDINGS: There is no acute fracture or dislocation. Knee alignment is normal.
The joint spaces are preserved. There is no effusion.
IMPRESSION: Normal knee radiographs.

## 2022-05-29 ENCOUNTER — Encounter: Payer: Self-pay | Admitting: Family Medicine

## 2022-05-29 ENCOUNTER — Ambulatory Visit: Payer: BC Managed Care – PPO | Admitting: Family Medicine

## 2022-05-29 ENCOUNTER — Ambulatory Visit
Admission: RE | Admit: 2022-05-29 | Discharge: 2022-05-29 | Disposition: A | Discharge status: Home / Self Care | Payer: BC Managed Care – PPO | Attending: Family Medicine | Admitting: Family Medicine

## 2022-05-29 ENCOUNTER — Ambulatory Visit
Admission: RE | Admit: 2022-05-29 | Discharge: 2022-05-29 | Disposition: A | Discharge status: Home / Self Care | Payer: BC Managed Care – PPO | Source: Ambulatory Visit | Attending: Family Medicine | Admitting: Family Medicine

## 2022-05-29 VITALS — BP 124/80 | HR 60 | Ht 74.0 in | Wt 206.0 lb

## 2022-05-29 DIAGNOSIS — M5416 Radiculopathy, lumbar region: Secondary | ICD-10-CM

## 2022-05-29 DIAGNOSIS — Z23 Encounter for immunization: Secondary | ICD-10-CM | POA: Insufficient documentation

## 2022-05-29 DIAGNOSIS — M4726 Other spondylosis with radiculopathy, lumbar region: Secondary | ICD-10-CM | POA: Insufficient documentation

## 2022-05-29 DIAGNOSIS — M47816 Spondylosis without myelopathy or radiculopathy, lumbar region: Secondary | ICD-10-CM | POA: Diagnosis not present

## 2022-05-29 MED ORDER — DICLOFENAC SODIUM 75 MG PO TBEC: 75.0000 mg | DELAYED_RELEASE_TABLET | Freq: Two times a day (BID) | ORAL | 0 refills | Status: DC

## 2022-05-29 MED ORDER — CYCLOBENZAPRINE HCL 5 MG PO TABS: 5.0000 mg | ORAL_TABLET | Freq: Every evening | ORAL | 1 refills | Status: DC | PRN

## 2022-05-29 NOTE — Assessment & Plan Note (Signed)
Tdap administered today

## 2022-05-29 NOTE — Assessment & Plan Note (Signed)
>>  ASSESSMENT AND PLAN FOR LUMBAR RADICULOPATHY WRITTEN ON 05/29/2022  9:09 PM BY Avaleigh Decuir, Earley Abide, MD  Acute on chronic issue ongoing for years, most recent exacerbation over the past 2-3 weeks, atraumatic in onset.  Has been increasing his physical activity in the gym over the past 3-4 months, no issues during activity, noted afterwards, resolves with relative rest, sporadic dosing of Aleve.  He describes symmetric central low back pain, some radiation circumferentially, additional radiation involving paresthesias into bilateral legs outer into foot, no weakness, saddle paresthesia reported.  Pain worst with prolonged sitting.  Examination with pain though negative straight leg raise bilaterally, equivocal FABER on right, negative FADIR bilaterally and negative FABER on left, stork test localizes to lower lumbar bilaterally.  Given the chronicity of symptoms, bilateral radicular features, worsening symptoms with flexion, concern for intervertebral discogenic radiculopathy, consider an element of degenerative changes.  Plan for dedicated x-rays, scheduled diclofenac and as needed cyclobenzaprine, shutdown from typical athletics, can start spinal conditioning program, close follow-up in 4 weeks for reevaluation.

## 2022-05-29 NOTE — Progress Notes (Signed)
     Primary Care / Sports Medicine Office Visit  Patient Information:  Patient ID: Cory Hamilton, male DOB: [birth year] Age: 32 y.o. MRN: [id number]   Cory Hamilton is a pleasant 32 y.o. male presenting with the following:  Chief Complaint  Patient presents with   Back Pain    2-3 weeks    Vitals:   05/29/22 1428  BP: 124/80  Pulse: 60  SpO2: 97%   Vitals:   05/29/22 1428  Weight: 206 lb (93.4 kg)  Height: 6' 2"$  (1.88 m)   Body mass index is 26.45 kg/m.  No results found.   Independent interpretation of notes and tests performed by another provider:   None  Procedures performed:   None  Pertinent History, Exam, Impression, and Recommendations:   Cory Hamilton was seen today for back pain.  Lumbar radiculopathy Assessment & Plan: Acute on chronic issue ongoing for years, most recent exacerbation over the past 2-3 weeks, atraumatic in onset.  Has been increasing his physical activity in the gym over the past 3-4 months, no issues during activity, noted afterwards, resolves with relative rest, sporadic dosing of Aleve.  He describes symmetric central low back pain, some radiation circumferentially, additional radiation involving paresthesias into bilateral legs outer into foot, no weakness, saddle paresthesia reported.  Pain worst with prolonged sitting.  Examination with pain though negative straight leg raise bilaterally, equivocal FABER on right, negative FADIR bilaterally and negative FABER on left, stork test localizes to lower lumbar bilaterally.  Given the chronicity of symptoms, bilateral radicular features, worsening symptoms with flexion, concern for intervertebral discogenic radiculopathy, consider an element of degenerative changes.  Plan for dedicated x-rays, scheduled diclofenac and as needed cyclobenzaprine, shutdown from typical athletics, can start spinal conditioning program, close follow-up in 4 weeks for reevaluation.  Orders: -      Diclofenac Sodium; Take 1 tablet (75 mg total) by mouth 2 (two) times daily.  Dispense: 60 tablet; Refill: 0 -     Cyclobenzaprine HCl; Take 1-2 tablets (5-10 mg total) by mouth at bedtime as needed for muscle spasms.  Dispense: 30 tablet; Refill: 1 -     DG Lumbar Spine Complete; Future  Need for Tdap vaccination Assessment & Plan: Tdap administered today  Orders: -     Tdap vaccine greater than or equal to 7yo IM     Orders & Medications Meds ordered this encounter  Medications   diclofenac (VOLTAREN) 75 MG EC tablet    Sig: Take 1 tablet (75 mg total) by mouth 2 (two) times daily.    Dispense:  60 tablet    Refill:  0   cyclobenzaprine (FLEXERIL) 5 MG tablet    Sig: Take 1-2 tablets (5-10 mg total) by mouth at bedtime as needed for muscle spasms.    Dispense:  30 tablet    Refill:  1   Orders Placed This Encounter  Procedures   DG Lumbar Spine Complete   Tdap vaccine greater than or equal to 7yo IM     No follow-ups on file.     Montel Culver, MD, [facility]   Primary Care Sports Medicine Primary Care and Sports Medicine at [facility]

## 2022-05-29 NOTE — Assessment & Plan Note (Signed)
Acute on chronic issue ongoing for years, most recent exacerbation over the past 2-3 weeks, atraumatic in onset.  Has been increasing his physical activity in the gym over the past 3-4 months, no issues during activity, noted afterwards, resolves with relative rest, sporadic dosing of Aleve.  He describes symmetric central low back pain, some radiation circumferentially, additional radiation involving paresthesias into bilateral legs outer into foot, no weakness, saddle paresthesia reported.  Pain worst with prolonged sitting.  Examination with pain though negative straight leg raise bilaterally, equivocal FABER on right, negative FADIR bilaterally and negative FABER on left, stork test localizes to lower lumbar bilaterally.  Given the chronicity of symptoms, bilateral radicular features, worsening symptoms with flexion, concern for intervertebral discogenic radiculopathy, consider an element of degenerative changes.  Plan for dedicated x-rays, scheduled diclofenac and as needed cyclobenzaprine, shutdown from typical athletics, can start spinal conditioning program, close follow-up in 4 weeks for reevaluation.

## 2022-05-29 NOTE — Patient Instructions (Signed)
-   Activity modification as discussed (avoid weights, open chain activities i.e. running) - Take diclofenac daily, 2nd dose as-needed (take with food, no other NSAIDs while on this) - Can use cyclobenzaprine nightly as-needed - Obtain x-rays - Return as scheduled

## 2022-06-07 ENCOUNTER — Ambulatory Visit: Payer: BC Managed Care – PPO | Admitting: Family Medicine

## 2022-06-16 ENCOUNTER — Encounter: Payer: Self-pay | Admitting: Family Medicine

## 2022-06-16 ENCOUNTER — Ambulatory Visit: Payer: BC Managed Care – PPO | Admitting: Family Medicine

## 2022-06-16 VITALS — BP 110/78 | HR 78 | Ht 74.0 in | Wt 206.0 lb

## 2022-06-16 DIAGNOSIS — M4726 Other spondylosis with radiculopathy, lumbar region: Secondary | ICD-10-CM

## 2022-06-16 DIAGNOSIS — M5416 Radiculopathy, lumbar region: Secondary | ICD-10-CM

## 2022-06-16 DIAGNOSIS — M47815 Spondylosis without myelopathy or radiculopathy, thoracolumbar region: Secondary | ICD-10-CM

## 2022-06-16 MED ORDER — PREDNISONE 50 MG PO TABS: 50.0000 mg | ORAL_TABLET | Freq: Every day | ORAL | 0 refills | Status: DC

## 2022-06-16 MED ORDER — DICLOFENAC SODIUM 75 MG PO TBEC: 75.0000 mg | DELAYED_RELEASE_TABLET | Freq: Two times a day (BID) | ORAL | 0 refills | Status: DC | PRN

## 2022-06-16 MED ORDER — TIZANIDINE HCL 4 MG PO TABS: 4.0000 mg | ORAL_TABLET | Freq: Every evening | ORAL | 0 refills | Status: DC | PRN

## 2022-06-16 MED ORDER — GABAPENTIN 300 MG PO CAPS: 300.0000 mg | ORAL_CAPSULE | Freq: Every evening | ORAL | 0 refills | Status: DC | PRN

## 2022-06-16 NOTE — Assessment & Plan Note (Signed)
See additional assessment(s) for plan details. 

## 2022-06-16 NOTE — Progress Notes (Signed)
Primary Care / Sports Medicine Office Visit  Patient Information:  Patient ID: Cory Hamilton, male DOB: 1990/07/31 Age: 32 y.o. MRN: [id number]   Cory Hamilton is a pleasant 32 y.o. male presenting with the following:  Chief Complaint  Patient presents with   Lumbar radiculopathy    Vitals:   06/16/22 0939  BP: 110/78  Pulse: 78  SpO2: 98%   Vitals:   06/16/22 0939  Weight: 206 lb (93.4 kg)  Height: '6\' 2"'$  (1.88 m)   Body mass index is 26.45 kg/m.  DG Lumbar Spine Complete  Result Date: 05/29/2022 CLINICAL DATA:  Acute on chronic back pain and radiculopathy. EXAM: LUMBAR SPINE - COMPLETE 5 VIEW COMPARISON:  None Available. FINDINGS: No compression deformities. No spondylolisthesis. No osteolytic or osteoblastic changes. There is disc space narrowing consistent with early degenerative changes noted at the T11-L1 levels and at L5-S1. IMPRESSION: 1. Mild/early degenerative changes as described. 2. No acute osseous abnormalities. 3. MRI may be helpful to evaluate for disc disease as a potential etiology of radicular symptoms. Electronically Signed   By: Sammie Bench M.D.   On: 05/29/2022 22:23     Independent interpretation of notes and tests performed by another provider:   None  Procedures performed:   None  Pertinent History, Exam, Impression, and Recommendations:   Cory Hamilton was seen today for lumbar radiculopathy.  Osteoarthritis of spine with radiculopathy, lumbar region Assessment & Plan: Acute on chronic, patient was able to obtain x-rays over interim visit, was noting steady improvement with medications and home exercises, relative activity modification.  Unfortunate, noted mild recurrence which progressed to severe over the past few days the setting of slight return to athletics and picking up child.  Noting significant tightness, limited range of motion, discomfort throughout the thoracolumbar region.  Given recent x-ray findings,  presentation, concern for exacerbation of underlying intervertebral disc disorder/spondyloarthropathy affecting the thoracolumbar and lumbosacral spine.  Plan for 5-day course of prednisone, transition to nightly gabapentin and tizanidine as needed, after prednisone complete, utilize diclofenac and remaining medications as needed.  He is to start formal physical therapy as well, will return for annual physical where we can follow-up on this issue.  Recalcitrant symptoms to be addressed with advanced imaging.  I have placed a referral to pain and spine group for additional management options and lead up time to appointment.  Orders: -     Ambulatory referral to Pain Clinic -     predniSONE; Take 1 tablet (50 mg total) by mouth daily.  Dispense: 5 tablet; Refill: 0 -     tiZANidine HCl; Take 1 tablet (4 mg total) by mouth at bedtime as needed for muscle spasms.  Dispense: 30 tablet; Refill: 0 -     Diclofenac Sodium; Take 1 tablet (75 mg total) by mouth 2 (two) times daily as needed.  Dispense: 60 tablet; Refill: 0 -     Ambulatory referral to Physical Therapy  Spondylosis of thoracolumbar spine Assessment & Plan: See additional assessment(s) for plan details.  Orders: -     Ambulatory referral to Pain Clinic -     predniSONE; Take 1 tablet (50 mg total) by mouth daily.  Dispense: 5 tablet; Refill: 0 -     tiZANidine HCl; Take 1 tablet (4 mg total) by mouth at bedtime as needed for muscle spasms.  Dispense: 30 tablet; Refill: 0 -     Diclofenac Sodium; Take 1 tablet (75 mg total) by mouth 2 (two) times  daily as needed.  Dispense: 60 tablet; Refill: 0 -     Ambulatory referral to Physical Therapy  Lumbar radiculopathy  Other orders -     Gabapentin; Take 1 capsule (300 mg total) by mouth at bedtime as needed.  Dispense: 30 capsule; Refill: 0     Orders & Medications Meds ordered this encounter  Medications   predniSONE (DELTASONE) 50 MG tablet    Sig: Take 1 tablet (50 mg total) by  mouth daily.    Dispense:  5 tablet    Refill:  0   tiZANidine (ZANAFLEX) 4 MG tablet    Sig: Take 1 tablet (4 mg total) by mouth at bedtime as needed for muscle spasms.    Dispense:  30 tablet    Refill:  0   diclofenac (VOLTAREN) 75 MG EC tablet    Sig: Take 1 tablet (75 mg total) by mouth 2 (two) times daily as needed.    Dispense:  60 tablet    Refill:  0   gabapentin (NEURONTIN) 300 MG capsule    Sig: Take 1 capsule (300 mg total) by mouth at bedtime as needed.    Dispense:  30 capsule    Refill:  0   Orders Placed This Encounter  Procedures   Ambulatory referral to Pain Clinic   Ambulatory referral to Physical Therapy     No follow-ups on file.     Montel Culver, MD, [facility]   Primary Care Sports Medicine Primary Care and Sports Medicine at [facility]

## 2022-06-16 NOTE — Telephone Encounter (Signed)
Please advise 

## 2022-06-16 NOTE — Patient Instructions (Addendum)
-   Dose prednisone for full course - While on prednisone and dose gabapentin (nerve medication) nightly - Can dose tizanidine (a muscle relaxer) nightly as needed - After prednisone complete, restart diclofenac on a 1-2 tablet daily regimen, can be taken as needed - Can also dose of both gabapentin and/or tizanidine as needed - Referral coordinator will contact to schedule physical therapy and pain/spine group - Return for annual physical as scheduled, contact us for any issues over the interim

## 2022-06-16 NOTE — Assessment & Plan Note (Signed)
Acute on chronic, patient was able to obtain x-rays over interim visit, was noting steady improvement with medications and home exercises, relative activity modification.  Unfortunate, noted mild recurrence which progressed to severe over the past few days the setting of slight return to athletics and picking up child.  Noting significant tightness, limited range of motion, discomfort throughout the thoracolumbar region.  Given recent x-ray findings, presentation, concern for exacerbation of underlying intervertebral disc disorder/spondyloarthropathy affecting the thoracolumbar and lumbosacral spine.  Plan for 5-day course of prednisone, transition to nightly gabapentin and tizanidine as needed, after prednisone complete, utilize diclofenac and remaining medications as needed.  He is to start formal physical therapy as well, will return for annual physical where we can follow-up on this issue.  Recalcitrant symptoms to be addressed with advanced imaging.  I have placed a referral to pain and spine group for additional management options and lead up time to appointment.

## 2022-06-19 ENCOUNTER — Encounter: Payer: Self-pay | Admitting: Family Medicine

## 2022-06-19 NOTE — Telephone Encounter (Signed)
Please review.  KP

## 2022-06-26 ENCOUNTER — Encounter: Payer: BC Managed Care – PPO | Admitting: Family Medicine

## 2022-07-04 DIAGNOSIS — M461 Sacroiliitis, not elsewhere classified: Secondary | ICD-10-CM | POA: Diagnosis not present

## 2022-07-04 DIAGNOSIS — M5451 Vertebrogenic low back pain: Secondary | ICD-10-CM | POA: Diagnosis not present

## 2022-07-12 DIAGNOSIS — M461 Sacroiliitis, not elsewhere classified: Secondary | ICD-10-CM | POA: Diagnosis not present

## 2022-07-12 DIAGNOSIS — M5451 Vertebrogenic low back pain: Secondary | ICD-10-CM | POA: Diagnosis not present

## 2022-07-26 DIAGNOSIS — M5451 Vertebrogenic low back pain: Secondary | ICD-10-CM | POA: Diagnosis not present

## 2022-07-26 DIAGNOSIS — M461 Sacroiliitis, not elsewhere classified: Secondary | ICD-10-CM | POA: Diagnosis not present

## 2022-08-07 ENCOUNTER — Encounter: Payer: Self-pay | Admitting: Family Medicine

## 2022-08-07 ENCOUNTER — Ambulatory Visit (INDEPENDENT_AMBULATORY_CARE_PROVIDER_SITE_OTHER): Payer: BC Managed Care – PPO | Admitting: Family Medicine

## 2022-08-07 VITALS — BP 138/86 | HR 64 | Ht 74.0 in | Wt 201.0 lb

## 2022-08-07 DIAGNOSIS — E559 Vitamin D deficiency, unspecified: Secondary | ICD-10-CM

## 2022-08-07 DIAGNOSIS — Z1159 Encounter for screening for other viral diseases: Secondary | ICD-10-CM

## 2022-08-07 DIAGNOSIS — Z0000 Encounter for general adult medical examination without abnormal findings: Secondary | ICD-10-CM | POA: Diagnosis not present

## 2022-08-07 DIAGNOSIS — J302 Other seasonal allergic rhinitis: Secondary | ICD-10-CM

## 2022-08-07 DIAGNOSIS — Z13220 Encounter for screening for lipoid disorders: Secondary | ICD-10-CM

## 2022-08-07 DIAGNOSIS — M4726 Other spondylosis with radiculopathy, lumbar region: Secondary | ICD-10-CM

## 2022-08-07 DIAGNOSIS — Z114 Encounter for screening for human immunodeficiency virus [HIV]: Secondary | ICD-10-CM

## 2022-08-07 NOTE — Patient Instructions (Signed)
-   Obtain fasting labs with orders provided (can have water or black coffee but otherwise no food or drink x 8 hours before labs) - Review information provided - Attend eye doctor annually, dentist every 6 months, work towards or maintain 30 minutes of moderate intensity physical activity at least 5 days per week, and consume a balanced diet - Return in 1 year for physical - Contact us for any questions between now and then  Additionally: - Finish out remaining physical therapy, discuss progression of home-based rehab and overseeing of current athletics (form, etc.) - Add fluticasone propionate (Flonase), 2 sprays in each nostril daily x 7 days then as needed - Can contact us if allergies are still symptomatic

## 2022-08-07 NOTE — Assessment & Plan Note (Signed)
Has come off of all medications as symptoms have improved, has completed sessions of PT with improvement and has one remaining visit. Held off from following with spine group as he has improved.  - Finish out remaining PT, advance to maintenance home program - Follow-up as-needed

## 2022-08-07 NOTE — Progress Notes (Signed)
Annual Physical Exam Visit  Patient Information:  Patient ID: Cory Hamilton, male DOB: Sep 03, 1990 Age: 32 y.o. MRN: [id number]   Subjective:   CC: Annual Physical Exam  HPI:  Cory Hamilton is here for their annual physical.  I reviewed the past medical history, family history, social history, surgical history, and allergies today and changes were made as necessary.  Please see the problem list section below for additional details.  Past Medical History: Past Medical History:  Diagnosis Date   Allergy    Community acquired pneumonia of left lower lobe of lung 12/01/2020   Elevated troponin 12/01/2020   NSTEMI (non-ST elevated myocardial infarction) [facility])    Past Surgical History: Past Surgical History:  Procedure Laterality Date   HIP ARTHROSCOPY Left 2016   Family History: Family History  Problem Relation Age of Onset   Cancer Maternal Grandmother    Stroke Maternal Grandfather    Alzheimer's disease Paternal Grandmother    Allergies: No Known Allergies Health Maintenance: Health Maintenance  Topic Date Due   HIV Screening  Never done   Hepatitis C Screening  Never done   COVID-19 Vaccine (1) 08/23/2022 (Originally 08/01/1990)   INFLUENZA VACCINE  11/09/2022   DTaP/Tdap/Td (2 - Td or Tdap) 05/29/2032   HPV VACCINES  Aged Out    HM Colonoscopy     This patient has no relevant Health Maintenance data.      Medications: No current outpatient medications on file prior to visit.   No current facility-administered medications on file prior to visit.    Review of Systems: No headache, visual changes, nausea, vomiting, diarrhea, constipation, dizziness, abdominal pain, skin rash, fevers, chills, night sweats, swollen lymph nodes, weight loss, chest pain, body aches, joint swelling, muscle aches, shortness of breath, mood changes, visual or auditory hallucinations reported.  Objective:   Vitals:   08/07/22 0834 08/07/22 0908  BP: (!) 188/76  138/86  Pulse: 64   SpO2: 98%    Vitals:   08/07/22 0834  Weight: 201 lb (91.2 kg)  Height: 6\' 2"  (1.88 m)   Body mass index is 25.81 kg/m.  General: Well Developed, well nourished, and in no acute distress.  Neuro: Alert and oriented x3, extra-ocular muscles intact, sensation grossly intact. Cranial nerves II through XII are grossly intact, motor, sensory, and coordinative functions are intact. HEENT: Normocephalic, atraumatic, pupils equal round reactive to light, neck supple, no masses, no lymphadenopathy, thyroid nonpalpable. Oropharynx, nasopharynx, external ear canals are unremarkable. Skin: Warm and dry, no rashes noted.  Cardiac: Regular rate and rhythm, no murmurs rubs or gallops. No peripheral edema. Pulses symmetric. Respiratory: Clear to auscultation bilaterally. Not using accessory muscles, speaking in full sentences.  Abdominal: Soft, nontender, nondistended, positive bowel sounds, no masses, no organomegaly. Musculoskeletal: Shoulder, elbow, wrist, hip, knee, ankle stable, and with full range of motion.   Impression and Recommendations:   The patient was counselled, risk factors were discussed, and anticipatory guidance given.  Problem List Items Addressed This Visit       Respiratory   Seasonal allergic rhinitis    Chronic, currently symptomatic despite OTC oral antihistamine.  - Start Flonase scheduled x 7 days then PRN - Can contact us if still symptomatic, can consider referral to Allergist, Rx Astelin        Nervous and Auditory   Osteoarthritis of spine with radiculopathy, lumbar region    Has come off of all medications as symptoms have improved, has completed sessions of PT  with improvement and has one remaining visit. Held off from following with spine group as he has improved.  - Finish out remaining PT, advance to maintenance home program - Follow-up as-needed        Other   Healthcare maintenance - Primary    Annual examination completed,  risk stratification labs ordered, anticipatory guidance provided.  We will follow labs once resulted.      Relevant Orders   CBC   Comprehensive metabolic panel   Hepatitis C antibody   HIV Antibody (routine testing w rflx)   Lipid panel   TSH   VITAMIN D 25 Hydroxy (Vit-D Deficiency, Fractures)   Other Visit Diagnoses     Vitamin D deficiency       Relevant Orders   VITAMIN D 25 Hydroxy (Vit-D Deficiency, Fractures)   Annual physical exam       Relevant Orders   CBC   Comprehensive metabolic panel   Hepatitis C antibody   HIV Antibody (routine testing w rflx)   Lipid panel   TSH   VITAMIN D 25 Hydroxy (Vit-D Deficiency, Fractures)   Screening for lipoid disorders       Relevant Orders   Comprehensive metabolic panel   Lipid panel   Need for hepatitis C screening test       Relevant Orders   Hepatitis C antibody   Screening for HIV (human immunodeficiency virus)       Relevant Orders   HIV Antibody (routine testing w rflx)        Orders & Medications Medications: No orders of the defined types were placed in this encounter.  Orders Placed This Encounter  Procedures   CBC   Comprehensive metabolic panel   Hepatitis C antibody   HIV Antibody (routine testing w rflx)   Lipid panel   TSH   VITAMIN D 25 Hydroxy (Vit-D Deficiency, Fractures)     Return in about 1 year (around 08/07/2023) for CPE.    Jerrol Banana, MD, [facility]   Primary Care Sports Medicine Primary Care and Sports Medicine at [facility]

## 2022-08-07 NOTE — Assessment & Plan Note (Signed)
Chronic, currently symptomatic despite OTC oral antihistamine.  - Start Flonase scheduled x 7 days then PRN - Can contact us if still symptomatic, can consider referral to Allergist, Rx Astelin

## 2022-08-07 NOTE — Assessment & Plan Note (Signed)
Annual examination completed, risk stratification labs ordered, anticipatory guidance provided.  We will follow labs once resulted. 

## 2022-08-08 ENCOUNTER — Other Ambulatory Visit: Payer: Self-pay | Admitting: Family Medicine

## 2022-08-08 LAB — COMPREHENSIVE METABOLIC PANEL
ALT: 19 IU/L (ref 0–44)
AST: 19 IU/L (ref 0–40)
Albumin/Globulin Ratio: 1.9 (ref 1.2–2.2)
Albumin: 4.4 g/dL (ref 4.1–5.1)
Alkaline Phosphatase: 53 IU/L (ref 44–121)
BUN/Creatinine Ratio: 13 (ref 9–20)
BUN: 13 mg/dL (ref 6–20)
Bilirubin Total: 0.5 mg/dL (ref 0.0–1.2)
CO2: 23 mmol/L (ref 20–29)
Calcium: 8.9 mg/dL (ref 8.7–10.2)
Chloride: 102 mmol/L (ref 96–106)
Creatinine, Ser: 1.01 mg/dL (ref 0.76–1.27)
Globulin, Total: 2.3 g/dL (ref 1.5–4.5)
Glucose: 93 mg/dL (ref 70–99)
Potassium: 4.7 mmol/L (ref 3.5–5.2)
Sodium: 139 mmol/L (ref 134–144)
Total Protein: 6.7 g/dL (ref 6.0–8.5)
eGFR: 101 mL/min/{1.73_m2} (ref >59)

## 2022-08-08 LAB — CBC
Hematocrit: 46.7 % (ref 37.5–51.0)
Hemoglobin: 15.5 g/dL (ref 13.0–17.7)
MCH: 28.5 pg (ref 26.6–33.0)
MCHC: 33.2 g/dL (ref 31.5–35.7)
MCV: 86 fL (ref 79–97)
Platelets: 224 10*3/uL (ref 150–450)
RBC: 5.43 x10E6/uL (ref 4.14–5.80)
RDW: 13.1 % (ref 11.6–15.4)
WBC: 3.7 10*3/uL (ref 3.4–10.8)

## 2022-08-08 LAB — LIPID PANEL
Chol/HDL Ratio: 3.4 ratio (ref 0.0–5.0)
Cholesterol, Total: 196 mg/dL (ref 100–199)
HDL: 58 mg/dL (ref >39)
LDL Chol Calc (NIH): 123 mg/dL — ABNORMAL HIGH (ref 0–99)
Triglycerides: 83 mg/dL (ref 0–149)
VLDL Cholesterol Cal: 15 mg/dL (ref 5–40)

## 2022-08-08 LAB — TSH: TSH: 2.42 u[IU]/mL (ref 0.450–4.500)

## 2022-08-08 LAB — HIV ANTIBODY (ROUTINE TESTING W REFLEX): HIV Screen 4th Generation wRfx: NONREACTIVE

## 2022-08-08 LAB — VITAMIN D 25 HYDROXY (VIT D DEFICIENCY, FRACTURES): Vit D, 25-Hydroxy: 21.4 ng/mL — ABNORMAL LOW (ref 30.0–100.0)

## 2022-08-08 LAB — HEPATITIS C ANTIBODY: Hep C Virus Ab: NONREACTIVE

## 2022-08-08 MED ORDER — VITAMIN D (ERGOCALCIFEROL) 1.25 MG (50000 UNIT) PO CAPS: 50000.0000 [IU] | ORAL_CAPSULE | ORAL | 0 refills | Status: DC

## 2022-08-09 DIAGNOSIS — M461 Sacroiliitis, not elsewhere classified: Secondary | ICD-10-CM | POA: Diagnosis not present

## 2022-08-09 DIAGNOSIS — M5451 Vertebrogenic low back pain: Secondary | ICD-10-CM | POA: Diagnosis not present

## 2022-09-05 DIAGNOSIS — M5451 Vertebrogenic low back pain: Secondary | ICD-10-CM | POA: Diagnosis not present

## 2022-09-05 DIAGNOSIS — M461 Sacroiliitis, not elsewhere classified: Secondary | ICD-10-CM | POA: Diagnosis not present

## 2022-12-10 DIAGNOSIS — S92353A Displaced fracture of fifth metatarsal bone, unspecified foot, initial encounter for closed fracture: Secondary | ICD-10-CM

## 2022-12-10 HISTORY — DX: Displaced fracture of fifth metatarsal bone, unspecified foot, initial encounter for closed fracture: S92.353A

## 2023-01-01 DIAGNOSIS — S92354A Nondisplaced fracture of fifth metatarsal bone, right foot, initial encounter for closed fracture: Secondary | ICD-10-CM | POA: Diagnosis not present

## 2023-01-08 DIAGNOSIS — S92351A Displaced fracture of fifth metatarsal bone, right foot, initial encounter for closed fracture: Secondary | ICD-10-CM | POA: Diagnosis not present

## 2023-01-12 ENCOUNTER — Ambulatory Visit: Payer: BC Managed Care – PPO | Admitting: Family Medicine

## 2023-01-18 DIAGNOSIS — S92351A Displaced fracture of fifth metatarsal bone, right foot, initial encounter for closed fracture: Secondary | ICD-10-CM | POA: Diagnosis not present

## 2023-02-12 DIAGNOSIS — S92351D Displaced fracture of fifth metatarsal bone, right foot, subsequent encounter for fracture with routine healing: Secondary | ICD-10-CM | POA: Diagnosis not present

## 2023-05-14 DIAGNOSIS — S92351D Displaced fracture of fifth metatarsal bone, right foot, subsequent encounter for fracture with routine healing: Secondary | ICD-10-CM | POA: Diagnosis not present

## 2023-05-27 ENCOUNTER — Encounter: Payer: Self-pay | Admitting: Family Medicine

## 2023-05-28 ENCOUNTER — Other Ambulatory Visit: Payer: Self-pay

## 2023-05-28 DIAGNOSIS — Z3009 Encounter for other general counseling and advice on contraception: Secondary | ICD-10-CM

## 2023-05-28 NOTE — Telephone Encounter (Signed)
Referral placed.  KP 

## 2023-05-28 NOTE — Telephone Encounter (Signed)
 Please review.  KP

## 2023-06-27 ENCOUNTER — Ambulatory Visit: Payer: BC Managed Care – PPO | Admitting: Urology

## 2023-06-27 VITALS — BP 111/73 | HR 59 | Ht 74.0 in | Wt 205.0 lb

## 2023-06-27 DIAGNOSIS — Z3009 Encounter for other general counseling and advice on contraception: Secondary | ICD-10-CM

## 2023-06-27 MED ORDER — DIAZEPAM 5 MG PO TABS: 5.0000 mg | ORAL_TABLET | Freq: Once | ORAL | 0 refills | Status: DC | PRN

## 2023-06-27 NOTE — Patient Instructions (Addendum)
 Pre-Vasectomy Instructions  STOP all aspirin or blood thinners (Aspirin, Plavix, Coumadin, Warfarin, Motrin, Ibuprofen, Advil, Aleve, Naproxen, Naprosyn) for 7 days prior to the procedure.  If you have any questions about stopping these medications please contact your primary care physician or cardiologist.  Shave all hair from the upper scrotum on the day of the procedure.  This means just under the penis onto the scrotal sac.  The area shaved should measure about 2-3 inches around.  You may lather the scrotum with soap and water, and shave with a safety razor.  After shaving the area, thoroughly wash the penis and the scrotum, then shower or bathe to remove all the loose hairs.  If needed, wash the area again just before coming in for your Vasectomy.  It is recommended to have a light meal an hour or so prior to the procedure.  Bring a scrotal support (jock strap or suspensory, or tight jockey shorts or underwear).  Wear comfortable pants or shorts.  While the actual procedure usually takes about 45 minutes, you should be prepared to stay in the office for approximately one hour.  Bring someone with you to drive you home.  If you have any questions or concerns, please feel free to call the office at [phone].  Vasectomy Vasectomy is a procedure to cut and then tie or burn the ends of the vas deferens. The vas deferens is a tube that carries sperm from the testicle to the urethra. This procedure blocks sperm from being released during sex. This ensures that sperm does not go into the vagina. A vasectomy does not affect your ability to have sex or your desire for sex. Also, it does not prevent sexually transmitted infections, or STIs. Vasectomy is a permanent and effective form of birth control. You should have a vasectomy only when you and your partner are sure you do not want children in the future. Do not get this procedure when you are stressed, such as after divorce or pregnancy  loss. Tell a health care provider about: Any allergies you have. All medicines you are taking. These include vitamins, herbs, eye drops, creams, and over-the-counter medicines. Any problems you or family members have had with anesthesia. Any bleeding problems you have. Any surgeries you have had. Any medical conditions you have. What are the risks? Your provider will talk with you about risks. These may include: Infection. Bleeding and swelling of the scrotum. The scrotum is the sac that contains the testicles. Allergies to medicines. Failure of the procedure to prevent pregnancy. There is a very small chance that the tied or burned parts of the vas deferens may reconnect. If this happens, you could still make a person pregnant. Pain in the scrotum that goes on after you heal from the procedure. What happens before the procedure? Medicines Ask your health care provider about: Changing or stopping your regular medicines. These include any diabetes medicines or blood thinners you take. Taking medicines such as aspirin and ibuprofen. These medicines can thin your blood. Do not take them unless your provider tells you to take them. Taking over-the-counter medicines, vitamins, herbs, and supplements. You may be told to take a sedative a few hours before the procedure. A sedative helps you relax. Surgery safety Ask your provider: How your surgery site will be marked. What steps will be taken to help prevent infection. These steps may include: Removing hair at the surgery site. Washing skin with a soap that kills germs. Taking antibiotics. General instructions Do  not use any products that contain nicotine or tobacco for at least 4 weeks before the procedure. These products include cigarettes, chewing tobacco, and vaping devices, such as e-cigarettes. If you need help quitting, ask your provider. If you'll be going home right after the procedure, plan to have a responsible adult: Take you  home from the hospital or clinic. You'll not be allowed to drive. Care for you for the time you are told. What happens during the procedure?  You may be given: A sedative. This helps you relax. You may also be told to take this a few hours before the procedure. Anesthesia. This keeps you from feeling pain. It will numb certain areas of your body. Your provider will feel for your vas deferens. To get to the vas deferens, your provider may: Make a very small cut, or incision, in your scrotum. Make a hole by piercing the scrotum. Your vas deferens will be pulled out of your scrotum and cut. To close it, the cut ends of the vas deferens will be tied or burned. The vas deferens will be put back into your scrotum. The cut or the hole in the scrotum will be closed with stitches. The stitches will dissolve and will not need to be removed. The procedure will be done again on the other side of your scrotum. The procedure may vary among providers and hospitals. What happens after the procedure? You will be monitored to make sure that you do not have problems. You will be asked not to ejaculate for at least 1 week after the procedure, or for as long as you are told. You will need to use another form of birth control for 2-4 months after the procedure. Do this until your provider confirms that there's no sperm in your semen. You may be given something to wear to support your scrotum. This includes a jockstrap or underwear with a pouch. If you were given a sedative during your procedure, do not drive or use machines until your provider says that it's safe. This information is not intended to replace advice given to you by your health care provider. Make sure you discuss any questions you have with your health care provider. Document Revised: 05/30/2022 Document Reviewed: 05/30/2022 Elsevier Patient Education  2024 [employer].

## 2023-06-27 NOTE — Progress Notes (Signed)
   06/27/23 10:34 AM   Pati Gallo Aug 09, 1990 [id number]  CC: Discuss vasectomy  HPI: 33 year old male with 2 children interested in vasectomy for permanent sterilization.  He denies any family history of prostate cancer.   PMH: Past Medical History:  Diagnosis Date   Allergy    Community acquired pneumonia of left lower lobe of lung 12/01/2020   Elevated troponin 12/01/2020   NSTEMI (non-ST elevated myocardial infarction) [facility])     Family History: Family History  Problem Relation Age of Onset   Cancer Maternal Grandmother    Stroke Maternal Grandfather    Alzheimer's disease Paternal Grandmother     Social History:  reports that he has never smoked. He has never used smokeless tobacco. He reports current alcohol use of about 9.0 standard drinks of alcohol per week. He reports that he does not use drugs.  Physical Exam: BP 111/73   Pulse (!) 59   Ht 6\' 2"  (1.88 m)   Wt 205 lb (93 kg)   BMI 26.32 kg/m    Constitutional:  Alert and oriented, No acute distress. Cardiovascular: No clubbing, cyanosis, or edema. Respiratory: Normal respiratory effort, no increased work of breathing. GI: Abdomen is soft, nontender, nondistended, no abdominal masses GU: Testicles 20 cc and descended bilaterally without masses, vas deferens easily palpable bilaterally   Assessment & Plan:   33 year old male interested in vasectomy for permanent sterilization  We discussed the risks and benefits of vasectomy at length.  Vasectomy is intended to be a permanent form of contraception, and does not produce immediate sterility.  Following vasectomy another form of contraception is required until vas occlusion is confirmed by a post-vasectomy semen analysis obtained 2-3 months after the procedure.  Even after vas occlusion is confirmed, vasectomy is not 100% reliable in preventing pregnancy, and the failure rate is approximately 04/1998.  Repeat vasectomy is required in less than 1% of  patients.  He should refrain from ejaculation for 1 week after vasectomy.  Options for fertility after vasectomy include vasectomy reversal, and sperm retrieval with in vitro fertilization or ICSI.  These options are not always successful and may be expensive.  Finally, there are other permanent and non-permanent alternatives to vasectomy available. There is no risk of erectile dysfunction, and the volume of semen will be similar to prior, as the majority of the ejaculate is from the prostate and seminal vesicles.   The procedure takes ~20 minutes.  We recommend patients take 5-10 mg of Valium 30 minutes prior, and he will need a driver post-procedure.  Local anesthetic is injected into the scrotal skin and a small segment of the vas deferens is removed, and the ends occluded. The complication rate is approximately 1-2%, and includes bleeding, infection, and development of chronic scrotal pain.  PLAN: Schedule vasectomy Valium sent to pharmacy   Legrand Rams, MD 06/27/2023  [facility], [facility] [address] [place], [place] [address] [phone]

## 2023-08-08 ENCOUNTER — Encounter: Payer: Self-pay | Admitting: Family Medicine

## 2023-08-08 ENCOUNTER — Ambulatory Visit (INDEPENDENT_AMBULATORY_CARE_PROVIDER_SITE_OTHER): Payer: Self-pay | Admitting: Family Medicine

## 2023-08-08 VITALS — BP 110/72 | HR 88 | Ht 74.0 in | Wt 206.0 lb

## 2023-08-08 DIAGNOSIS — J301 Allergic rhinitis due to pollen: Secondary | ICD-10-CM

## 2023-08-08 DIAGNOSIS — Z0000 Encounter for general adult medical examination without abnormal findings: Secondary | ICD-10-CM

## 2023-08-08 DIAGNOSIS — R7309 Other abnormal glucose: Secondary | ICD-10-CM | POA: Diagnosis not present

## 2023-08-08 DIAGNOSIS — Z136 Encounter for screening for cardiovascular disorders: Secondary | ICD-10-CM | POA: Diagnosis not present

## 2023-08-08 DIAGNOSIS — M222X1 Patellofemoral disorders, right knee: Secondary | ICD-10-CM

## 2023-08-08 DIAGNOSIS — R300 Dysuria: Secondary | ICD-10-CM | POA: Diagnosis not present

## 2023-08-08 DIAGNOSIS — M4726 Other spondylosis with radiculopathy, lumbar region: Secondary | ICD-10-CM

## 2023-08-08 NOTE — Progress Notes (Signed)
 Annual Physical Exam Visit  Patient Information:  Patient ID: Cory Hamilton, male DOB: 11-22-1990 Age: 33 y.o. MRN: [id number]   Subjective:   CC: Annual Physical Exam  HPI:  Cory Hamilton is here for their annual physical.  I reviewed the past medical history, family history, social history, surgical history, and allergies today and changes were made as necessary.  Please see the problem list section below for additional details.  Past Medical History: Past Medical History:  Diagnosis Date   Allergy    ANA positive 01/21/2021   Community acquired pneumonia of left lower lobe of lung 12/01/2020   Elevated troponin 12/01/2020   Fracture of base of fifth metatarsal bone 12/2022   Myopericarditis 12/01/2020   NSTEMI (non-ST elevated myocardial infarction) [facility])    Past Surgical History: Past Surgical History:  Procedure Laterality Date   HIP ARTHROSCOPY Left 2016   Family History: Family History  Problem Relation Age of Onset   Cancer Maternal Grandmother    Stroke Maternal Grandfather    Alzheimer's disease Paternal Grandmother    Allergies: No Known Allergies Health Maintenance: Health Maintenance  Topic Date Due   COVID-19 Vaccine (1 - 2024-25 season) 08/24/2023 (Originally 12/10/2022)   INFLUENZA VACCINE  11/09/2023   DTaP/Tdap/Td (2 - Td or Tdap) 05/29/2032   Hepatitis C Screening  Completed   HIV Screening  Completed   HPV VACCINES  Aged Out   Meningococcal B Vaccine  Aged Out    HM Colonoscopy   This patient has no relevant Health Maintenance data.    Medications: Current Outpatient Medications on File Prior to Visit  Medication Sig Dispense Refill   fexofenadine (ALLEGRA ODT) 30 MG disintegrating tablet Take 30 mg by mouth daily.     fluticasone (FLONASE) 50 MCG/ACT nasal spray Place into both nostrils daily.     diazepam  (VALIUM ) 5 MG tablet Take 1 tablet (5 mg total) by mouth once as needed for up to 1 dose (take 45 minutes prior  to vasectomy). (Patient not taking: Reported on 08/08/2023) 1 tablet 0   No current facility-administered medications on file prior to visit.    Objective:   Vitals:   08/08/23 0836  BP: 110/72  Pulse: 88  SpO2: 97%   Vitals:   08/08/23 0836  Weight: 206 lb (93.4 kg)  Height: 6\' 2"  (1.88 m)   Body mass index is 26.45 kg/m.  General: Well Developed, well nourished, and in no acute distress.  Neuro: Alert and oriented x3, extra-ocular muscles intact, sensation grossly intact. Cranial nerves II through XII are grossly intact, motor, sensory, and coordinative functions are intact. HEENT: Normocephalic, atraumatic, neck supple, no masses, no lymphadenopathy, thyroid  nonenlarged. Oropharynx, nasopharynx, external ear canals are unremarkable. Skin: Warm and dry, no rashes noted.  Cardiac: Regular rate and rhythm, no murmurs rubs or gallops. No peripheral edema. Pulses symmetric. Respiratory: Clear to auscultation bilaterally. Speaking in full sentences.  Abdominal: Soft, nontender, nondistended, positive bowel sounds, no masses, no organomegaly. Musculoskeletal: Stable, and with full range of motion.  Impression and Recommendations:   The patient was counselled, risk factors were discussed, and anticipatory guidance given.  Problem List Items Addressed This Visit     Dysuria   Urinary symptoms Intermittent burning and difficulty urinating, possibly due to bacterial presence or post-coital irritation. Symptoms resolve without systemic symptoms. - Order urinalysis to check for infection or bacterial overgrowth. - Advise increased hydration, especially on symptomatic days. - Monitor for changes in symptoms or  new symptoms such as fever, chills, or discharge.      Relevant Orders   CBC with Differential/Platelet   UA/M w/rflx Culture, Routine   GC/Chlamydia Probe Amp   RPR   Healthcare maintenance - Primary   Osteoarthritis of spine with radiculopathy, lumbar region   He  experiences occasional low back pain flare-ups, which have improved with physical therapy and daily stretching. Regular exercise and sleeping on his back help manage the pain. No significant low back pain is currently present  Low back pain -well-controlled Intermittent low back pain managed with exercise and stretching. Emphasized core stabilization and proper sleep positions to prevent exacerbation. - Continue regular exercise and stretching regimen. - Incorporate core stabilization exercises such as planks and side planks. - Maintain awareness of sleep positions to prevent exacerbation.      Right patellofemoral syndrome   His right knee remains stable with no current issues.      Seasonal allergic rhinitis   Allergic rhinitis Well-controlled with Flonase and Allegra. Discussed potential side effects and advised monitoring. - Continue Flonase year-round and Allegra seasonally as needed. - Monitor for signs of dryness or epistaxis with Flonase use. - Monitor for dryness or constipation with Allegra use.      Other Visit Diagnoses       Screening for cardiovascular condition       Relevant Orders   Comprehensive metabolic panel with GFR   Lipid panel     Abnormal glucose       Relevant Orders   Hemoglobin A1c        Orders & Medications Medications: No orders of the defined types were placed in this encounter.  Orders Placed This Encounter  Procedures   GC/Chlamydia Probe Amp   Comprehensive metabolic panel with GFR   Hemoglobin A1c   Lipid panel   CBC with Differential/Platelet   UA/M w/rflx Culture, Routine   RPR     Return in about 1 year (around 08/07/2024) for CPE.    Ma Saupe, MD, [facility]   Primary Care Sports Medicine Primary Care and Sports Medicine at [facility]

## 2023-08-09 ENCOUNTER — Encounter: Payer: Self-pay | Admitting: Family Medicine

## 2023-08-09 DIAGNOSIS — R300 Dysuria: Secondary | ICD-10-CM | POA: Insufficient documentation

## 2023-08-09 LAB — COMPREHENSIVE METABOLIC PANEL WITH GFR
ALT: 23 IU/L (ref 0–44)
AST: 27 IU/L (ref 0–40)
Albumin: 4.8 g/dL (ref 4.1–5.1)
Alkaline Phosphatase: 53 IU/L (ref 44–121)
BUN/Creatinine Ratio: 11 (ref 9–20)
BUN: 12 mg/dL (ref 6–20)
Bilirubin Total: 0.6 mg/dL (ref 0.0–1.2)
CO2: 23 mmol/L (ref 20–29)
Calcium: 9.2 mg/dL (ref 8.7–10.2)
Chloride: 101 mmol/L (ref 96–106)
Creatinine, Ser: 1.06 mg/dL (ref 0.76–1.27)
Globulin, Total: 1.9 g/dL (ref 1.5–4.5)
Glucose: 89 mg/dL (ref 70–99)
Potassium: 4.5 mmol/L (ref 3.5–5.2)
Sodium: 138 mmol/L (ref 134–144)
Total Protein: 6.7 g/dL (ref 6.0–8.5)
eGFR: 95 mL/min/{1.73_m2} (ref >59)

## 2023-08-09 LAB — LIPID PANEL
Chol/HDL Ratio: 3.8 ratio (ref 0.0–5.0)
Cholesterol, Total: 218 mg/dL — ABNORMAL HIGH (ref 100–199)
HDL: 58 mg/dL (ref >39)
LDL Chol Calc (NIH): 137 mg/dL — ABNORMAL HIGH (ref 0–99)
Triglycerides: 131 mg/dL (ref 0–149)
VLDL Cholesterol Cal: 23 mg/dL (ref 5–40)

## 2023-08-09 LAB — MICROSCOPIC EXAMINATION
Bacteria, UA: NONE SEEN
Casts: NONE SEEN /LPF
Epithelial Cells (non renal): NONE SEEN /HPF (ref 0–10)
WBC, UA: NONE SEEN /HPF (ref 0–5)

## 2023-08-09 LAB — CBC WITH DIFFERENTIAL/PLATELET
Basophils Absolute: 0 10*3/uL (ref 0.0–0.2)
Basos: 1 %
EOS (ABSOLUTE): 0.1 10*3/uL (ref 0.0–0.4)
Eos: 1 %
Hematocrit: 47.3 % (ref 37.5–51.0)
Hemoglobin: 16 g/dL (ref 13.0–17.7)
Immature Grans (Abs): 0 10*3/uL (ref 0.0–0.1)
Immature Granulocytes: 0 %
Lymphocytes Absolute: 1.7 10*3/uL (ref 0.7–3.1)
Lymphs: 26 %
MCH: 29.2 pg (ref 26.6–33.0)
MCHC: 33.8 g/dL (ref 31.5–35.7)
MCV: 86 fL (ref 79–97)
Monocytes Absolute: 0.4 10*3/uL (ref 0.1–0.9)
Monocytes: 6 %
Neutrophils Absolute: 4.4 10*3/uL (ref 1.4–7.0)
Neutrophils: 66 %
Platelets: 241 10*3/uL (ref 150–450)
RBC: 5.48 x10E6/uL (ref 4.14–5.80)
RDW: 12.7 % (ref 11.6–15.4)
WBC: 6.6 10*3/uL (ref 3.4–10.8)

## 2023-08-09 LAB — UA/M W/RFLX CULTURE, ROUTINE
Bilirubin, UA: NEGATIVE
Glucose, UA: NEGATIVE
Leukocytes,UA: NEGATIVE
Nitrite, UA: NEGATIVE
RBC, UA: NEGATIVE
Specific Gravity, UA: 1.029 (ref 1.005–1.030)
Urobilinogen, Ur: 1 mg/dL (ref 0.2–1.0)
pH, UA: 7 (ref 5.0–7.5)

## 2023-08-09 LAB — HEMOGLOBIN A1C
Est. average glucose Bld gHb Est-mCnc: 105 mg/dL
Hgb A1c MFr Bld: 5.3 % (ref 4.8–5.6)

## 2023-08-09 LAB — RPR: RPR Ser Ql: NONREACTIVE

## 2023-08-09 NOTE — Assessment & Plan Note (Signed)
 His right knee remains stable with no current issues.

## 2023-08-09 NOTE — Patient Instructions (Addendum)
-   Obtain fasting labs with orders provided (can have water or black coffee but otherwise no food or drink x 8 hours before labs) - Review information provided - Attend eye doctor annually, dentist every 6 months, work towards or maintain 30 minutes of moderate intensity physical activity at least 5 days per week, and consume a balanced diet - Return in 1 year for physical - Contact us  for any questions between now and then   Patient Plan  Dysuria: 1. Increase hydration, especially on days when symptoms occur. 2. Watch for new symptoms such as fever, chills, or discharge and contact your healthcare provider if they develop.  Low Back Pain: 1. Continue your regular exercise and stretching routine. 2. Add core stabilization exercises like planks and side planks. 3. Pay attention to your sleep positions to help prevent pain flare-ups.  Right Knee: - No action needed as the knee is stable with no current issues.  Seasonal Allergic Rhinitis: 1. Continue using Flonase year-round and Allegra seasonally as needed. 2. Monitor for dryness or nosebleeds with Flonase use. 3. Watch for dryness or constipation with Allegra use and adjust usage if needed.  Red Flags: - Contact your healthcare provider if you notice significant changes in symptoms or experience new concerning symptoms.

## 2023-08-09 NOTE — Assessment & Plan Note (Signed)
 Urinary symptoms Intermittent burning and difficulty urinating, possibly due to bacterial presence or post-coital irritation. Symptoms resolve without systemic symptoms. - Order urinalysis to check for infection or bacterial overgrowth. - Advise increased hydration, especially on symptomatic days. - Monitor for changes in symptoms or new symptoms such as fever, chills, or discharge.

## 2023-08-09 NOTE — Assessment & Plan Note (Signed)
 He experiences occasional low back pain flare-ups, which have improved with physical therapy and daily stretching. Regular exercise and sleeping on his back help manage the pain. No significant low back pain is currently present  Low back pain -well-controlled Intermittent low back pain managed with exercise and stretching. Emphasized core stabilization and proper sleep positions to prevent exacerbation. - Continue regular exercise and stretching regimen. - Incorporate core stabilization exercises such as planks and side planks. - Maintain awareness of sleep positions to prevent exacerbation.

## 2023-08-09 NOTE — Assessment & Plan Note (Signed)
 Allergic rhinitis Well-controlled with Flonase and Allegra. Discussed potential side effects and advised monitoring. - Continue Flonase year-round and Allegra seasonally as needed. - Monitor for signs of dryness or epistaxis with Flonase use. - Monitor for dryness or constipation with Allegra use.

## 2023-08-11 ENCOUNTER — Encounter: Payer: Self-pay | Admitting: Family Medicine

## 2023-08-11 LAB — GC/CHLAMYDIA PROBE AMP

## 2023-09-04 ENCOUNTER — Ambulatory Visit (INDEPENDENT_AMBULATORY_CARE_PROVIDER_SITE_OTHER): Admitting: Urology

## 2023-09-04 VITALS — BP 126/84 | HR 60 | Ht 74.0 in

## 2023-09-04 DIAGNOSIS — Z9852 Vasectomy status: Secondary | ICD-10-CM | POA: Diagnosis not present

## 2023-09-04 DIAGNOSIS — Z302 Encounter for sterilization: Secondary | ICD-10-CM

## 2023-09-04 NOTE — Patient Instructions (Signed)

## 2023-09-04 NOTE — Progress Notes (Signed)
 VASECTOMY PROCEDURE NOTE:  The patient was taken to the minor procedure room and placed in the supine position. His genitals were prepped and draped in the usual sterile fashion. The right vas deferens was brought up to the skin of the right upper scrotum. The skin overlying it was anesthetized with 1% lidocaine without epinephrine, anesthetic was also injected alongside the vas deferens in the direction of the inguinal canal. The no scalpel vasectomy instrument was used to make a small perforation in the scrotal skin. The vasectomy clamp was used to grasp the vas deferens. It was carefully dissected free from surrounding structures. A 1cm segment of the vas was removed, and the cut ends of the mucosa were cauterized. No significant bleeding was noted. The vas deferens was returned to the scrotum. The skin incision was closed with a simple interrupted stitch of 4-0 chromic.  Attention was then turned to the left side. The left vasectomy was performed in the same exact fashion. Sterile dressings were placed over each incision. The patient tolerated the procedure well.  IMPRESSION/DIAGNOSIS: The patient is a 33 year old gentleman who underwent a vasectomy today. Post-procedure instructions were reviewed. I stressed the importance of continuing to use birth control until he provides a semen specimen more than 2 months from now that demonstrates azoospermia.  We discussed return precautions including fever over 101, significant bleeding or hematoma, or uncontrolled pain. I also stressed the importance of avoiding strenuous activity for one week, no sexual activity or ejaculations for 5 days, intermittent icing over the next 48 hours, and scrotal support.   PLAN: The patient will be advised of his semen analysis results when available.  Jay Meth, MD 09/04/2023

## 2023-10-22 ENCOUNTER — Ambulatory Visit: Payer: Self-pay

## 2023-10-22 ENCOUNTER — Ambulatory Visit
Admission: EM | Admit: 2023-10-22 | Discharge: 2023-10-22 | Disposition: A | Discharge status: Home / Self Care | Attending: Emergency Medicine | Admitting: Emergency Medicine

## 2023-10-22 DIAGNOSIS — W57XXXA Bitten or stung by nonvenomous insect and other nonvenomous arthropods, initial encounter: Secondary | ICD-10-CM | POA: Diagnosis not present

## 2023-10-22 DIAGNOSIS — T7840XA Allergy, unspecified, initial encounter: Secondary | ICD-10-CM

## 2023-10-22 DIAGNOSIS — T63481A Toxic effect of venom of other arthropod, accidental (unintentional), initial encounter: Secondary | ICD-10-CM

## 2023-10-22 MED ORDER — EPINEPHRINE 0.3 MG/0.3ML IJ SOAJ: 0.3000 mg | Freq: Once | INTRAMUSCULAR | 1 refills | Status: AC

## 2023-10-22 MED ORDER — PREDNISONE 10 MG (21) PO TBPK: ORAL_TABLET | ORAL | 0 refills | Status: AC

## 2023-10-22 MED ORDER — CETIRIZINE HCL 10 MG PO TABS: 10.0000 mg | ORAL_TABLET | Freq: Two times a day (BID) | ORAL | 0 refills | Status: AC

## 2023-10-22 MED ORDER — IBUPROFEN 600 MG PO TABS: 600.0000 mg | ORAL_TABLET | Freq: Three times a day (TID) | ORAL | 0 refills | Status: AC | PRN

## 2023-10-22 MED ORDER — PREDNISONE 50 MG PO TABS
60.0000 mg | ORAL_TABLET | Freq: Once | ORAL | Status: AC
  Administered 2023-10-22: 60 mg via ORAL

## 2023-10-22 MED ORDER — FAMOTIDINE 20 MG PO TABS: 20.0000 mg | ORAL_TABLET | Freq: Two times a day (BID) | ORAL | 0 refills | Status: AC

## 2023-10-22 MED ORDER — IBUPROFEN 800 MG PO TABS
800.0000 mg | ORAL_TABLET | Freq: Once | ORAL | Status: AC
  Administered 2023-10-22: 800 mg via ORAL

## 2023-10-22 MED ORDER — FAMOTIDINE 40 MG PO TABS
40.0000 mg | ORAL_TABLET | Freq: Once | ORAL | Status: AC
  Administered 2023-10-22: 40 mg via ORAL

## 2023-10-22 NOTE — ED Provider Notes (Signed)
 HPI  SUBJECTIVE:  Cory Hamilton is a 33 y.o. male who presents with multiple yellowjacket stings sustained at 1530 today after mowing over a nest.  He reports burning pain, itching at the sting sites in the red, pruritic rash in his bilateral axilla, forehead, and torso.  He reports nausea, but no vomiting, angioedema, voice changes, sensation of throat swelling shut, coughing, wheezing, chest pain, pressure or heaviness, shortness of breath, diarrhea, lightheadedness, abdominal pain, syncope.  He reports GERD symptoms of burning chest pain, belching and waterbrash.  He tried topical Benadryl on the sting areas without improvement of symptoms.  No aggravating factors.  He has a past medical history of NSTEMI/elevated troponin from bacterial pneumonia, myopericarditis and has a history of environmental allergies.  No history of anaphylaxis.  PCP: [facility]   Past Medical History:  Diagnosis Date   Allergy    ANA positive 01/21/2021   Community acquired pneumonia of left lower lobe of lung 12/01/2020   Elevated troponin 12/01/2020   Fracture of base of fifth metatarsal bone 12/2022   Myopericarditis 12/01/2020   NSTEMI (non-ST elevated myocardial infarction) [facility])     Past Surgical History:  Procedure Laterality Date   HIP ARTHROSCOPY Left 2016    Family History  Problem Relation Age of Onset   Cancer Maternal Grandmother    Stroke Maternal Grandfather    Alzheimer's disease Paternal Grandmother     Social History   Tobacco Use   Smoking status: Never   Smokeless tobacco: Never  Vaping Use   Vaping status: Never Used  Substance Use Topics   Alcohol use: Yes    Alcohol/week: 9.0 standard drinks of alcohol    Types: 9 Standard drinks or equivalent per week   Drug use: Never    No current facility-administered medications for this encounter.  Current Outpatient Medications:    cetirizine  (ZYRTEC ) 10 MG tablet, Take 1 tablet (10 mg total) by mouth 2 (two)  times daily. Can increase to 20 mg bid, Disp: 24 tablet, Rfl: 0   EPINEPHrine  0.3 mg/0.3 mL IJ SOAJ injection, Inject 0.3 mg into the muscle once for 1 dose., Disp: 1 each, Rfl: 1   famotidine  (PEPCID ) 20 MG tablet, Take 1 tablet (20 mg total) by mouth 2 (two) times daily., Disp: 10 tablet, Rfl: 0   ibuprofen  (ADVIL ) 600 MG tablet, Take 1 tablet (600 mg total) by mouth every 8 (eight) hours as needed., Disp: 30 tablet, Rfl: 0   predniSONE  (STERAPRED UNI-PAK 21 TAB) 10 MG (21) TBPK tablet, Dispense one 6 day pack. Take as directed with food., Disp: 21 tablet, Rfl: 0   [Paused] fexofenadine (ALLEGRA ODT) 30 MG disintegrating tablet, Take 30 mg by mouth daily., Disp: , Rfl:    fluticasone (FLONASE) 50 MCG/ACT nasal spray, Place into both nostrils daily., Disp: , Rfl:   No Known Allergies   ROS  As noted in HPI.   Physical Exam  BP 126/78 (BP Location: Left Arm)   Pulse 61   Temp 98.5 F (36.9 C) (Oral)   Resp 18   SpO2 100%   Constitutional: Well developed, well nourished, no acute distress.  Normal voice. Eyes:  EOMI, conjunctiva normal bilaterally HENT: Normocephalic, atraumatic,mucus membranes moist.  No angioedema of the lips or tongue.  No drooling, trismus Respiratory: Normal inspiratory effort, lungs clear bilaterally, no wheezing Cardiovascular: Normal rate, regular rhythm GI: nondistended skin: Blanchable nontender erythematous macular rash forehead    Blanchable nontender erythematous macular rash bilateral axilla  Close up axillary rash   Multiple yellow jacket stings with surrounding blanchable erythema, edema bilateral ankles       Blanchable nontender rash torso     Musculoskeletal: no deformities Neurologic: Alert & oriented x 3, no focal neuro deficits Psychiatric: Speech and behavior appropriate   ED Course   Medications  predniSONE  (DELTASONE ) tablet 60 mg (60 mg Oral Given 10/22/23 1947)  ibuprofen  (ADVIL ) tablet 800 mg (800 mg Oral  Given 10/22/23 1948)  famotidine  (PEPCID ) tablet 40 mg (40 mg Oral Given 10/22/23 1948)    No orders of the defined types were placed in this encounter.   No results found for this or any previous visit (from the past 24 hours). No results found.  ED Clinical Impression  1. Systemic reaction to hymenoptera sting   2. Allergic reaction, initial encounter   3. Multiple insect bites      ED Assessment/Plan    Presentation consistent with allergic reaction to multiple yellow jacket stings.  There is no evidence of anaphylaxis at this time.  Gave patient prednisone  60 mg p.o., ibuprofen  800 mg p.o. Pepcid  40 mg p.o.  Unable to give him 50 mg of Benadryl as he has nobody to pick him up from here and unfortunately we do not have any second-generation antihistamines on hand.  Will reevaluate.  On reevaluation, patient states that he is feeling better.  Rash improving.  No angioedema.   Sending home with 6-day prednisone  taper, 6 days of Zyrtec  10 mg twice a day or Allegra 180 mg daily, 40 mg of Pepcid  once daily, ibuprofen  600 mg 3-4 times a day and and EpiPen   Discussed  MDM, treatment plan, and plan for follow-up with patient. Discussed sn/sx that should prompt return to the ED. patient agrees with plan.   Meds ordered this encounter  Medications   predniSONE  (DELTASONE ) tablet 60 mg   ibuprofen  (ADVIL ) tablet 800 mg   famotidine  (PEPCID ) tablet 40 mg   famotidine  (PEPCID ) 20 MG tablet    Sig: Take 1 tablet (20 mg total) by mouth 2 (two) times daily.    Dispense:  10 tablet    Refill:  0   cetirizine  (ZYRTEC ) 10 MG tablet    Sig: Take 1 tablet (10 mg total) by mouth 2 (two) times daily. Can increase to 20 mg bid    Dispense:  24 tablet    Refill:  0   EPINEPHrine  0.3 mg/0.3 mL IJ SOAJ injection    Sig: Inject 0.3 mg into the muscle once for 1 dose.    Dispense:  1 each    Refill:  1   predniSONE  (STERAPRED UNI-PAK 21 TAB) 10 MG (21) TBPK tablet    Sig: Dispense one 6 day pack.  Take as directed with food.    Dispense:  21 tablet    Refill:  0   ibuprofen  (ADVIL ) 600 MG tablet    Sig: Take 1 tablet (600 mg total) by mouth every 8 (eight) hours as needed.    Dispense:  30 tablet    Refill:  0      *This clinic note was created using Scientist, clinical (histocompatibility and immunogenetics). Therefore, there may be occasional mistakes despite careful proofreading.  ?    Van Knee, MD 10/23/23 [phone]

## 2023-10-22 NOTE — Telephone Encounter (Signed)
     FYI Only or Action Required?: FYI only for provider.  Patient was last seen in primary care on 08/08/2023 by Alvia Selinda PARAS, MD.  Called Nurse Triage reporting Insect Bite.  Symptoms began today.  Interventions attempted: Nothing.  Symptoms are: gradually worsening. PT was stung by up to 10 yellow jackets. Pt hs rash and itching in areas not stung. Pt also reporting some chest tightness.   Triage Disposition: Go to ED Now (Notify PCP)  Patient/caregiver understands and will follow disposition?: YesCopied from CRM [id number].                Topic: Clinical - Red Word Triage >> Oct 22, 2023  5:24 PM DeAngela L wrote: Red Word that prompted transfer to Nurse Triage: Pt was stung by maybe 10 yellow jackets and he might have possible tightness in his chest, he has rashes in there places where he was not stung  Pt num [phone] (M) Reason for Disposition  [1] Hives, itching, or swelling elsewhere on body (i.e., not a site of sting) AND [2] started within 2 hours of sting  (Exception: Only at site of sting.)  Answer Assessment - Initial Assessment Questions 1. TYPE: What type of sting was it? (e.g., bee, yellow jacket, unknown)      Yellow jacket 2. ONSET: When did it occur?      3 hours ago 3. LOCATION: Where is the sting located?  How many stings?     Mostly on legs - one on throat 4. SWELLING SIZE: How big is the swelling? (e.g., inches or cm)     Yes -  5. REDNESS: Is the area red or pink? If Yes, ask: What size is area of redness? (e.g., inches or cm). When did the redness start?     redness 6. PAIN: Is there any pain? If Yes, ask: How bad is it?  (Scale 0-10; or none, mild, moderate, severe)     yes 7. ITCHING: Is there any itching? If Yes, ask: How bad is it?      Bites are itchy - and itchy in places not stung 8. RESPIRATORY DISTRESS: Describe your breathing.     Feels chest tightness 9. PRIOR REACTIONS: Have you had any  severe allergic reactions to stings in the past? If Yes, ask: What happened?     no 10. OTHER SYMPTOMS: Do you have any other symptoms? (e.g., abdomen pain, face or tongue swelling, new rash elsewhere, vomiting)       Rash in places not stung  Protocols used: Bee or Yellow Jacket Sting-A-AH

## 2023-10-22 NOTE — ED Triage Notes (Addendum)
 Patient states that he was stung by yellow jackets today. Has a rash where he wasn't bit. Throat and chest feel weird like acid reflux. Wasn't having acid refulx before bee stings. Patient has rash underarms head and legs. Hasn't taken any meds.

## 2023-10-22 NOTE — Discharge Instructions (Signed)
 You can either take the Zyrtec  or you can take 180 mg of Allegra for the next 5 to 6 days. Take an antihistamine as soon as you get home.  Finish the prednisone  taper, Pepcid .  You may take ibuprofen  600 mg 3-4 times daily as needed for pain and swelling.  Use the EpiPen  and go to the ER for tongue or lip swelling, shortness of breath, wheezing, vomiting, if you get worse, diarrhea, if you pass out, or for other concerns.

## 2023-10-23 NOTE — Telephone Encounter (Signed)
 Spoke with patient he is doing better since UC visit and he has RX they gave him for the swelling from yellow jacket stings.

## 2023-10-23 NOTE — Telephone Encounter (Signed)
 Please review. JM

## 2023-12-05 ENCOUNTER — Other Ambulatory Visit

## 2023-12-05 DIAGNOSIS — Z9852 Vasectomy status: Secondary | ICD-10-CM | POA: Diagnosis not present

## 2023-12-05 DIAGNOSIS — Z302 Encounter for sterilization: Secondary | ICD-10-CM

## 2023-12-06 ENCOUNTER — Ambulatory Visit: Payer: Self-pay | Admitting: Urology

## 2023-12-06 LAB — POST-VAS SPERM EVALUATION,QUAL: Volume: 2.8 mL

## 2024-01-27 DIAGNOSIS — B029 Zoster without complications: Secondary | ICD-10-CM | POA: Diagnosis not present

## 2024-01-27 DIAGNOSIS — R21 Rash and other nonspecific skin eruption: Secondary | ICD-10-CM | POA: Diagnosis not present

## 2024-01-27 DIAGNOSIS — M5489 Other dorsalgia: Secondary | ICD-10-CM | POA: Diagnosis not present

## 2024-08-08 ENCOUNTER — Encounter: Admitting: Family Medicine
